# Patient Record
Sex: Male | Born: 1950 | Race: White | Hispanic: No | Marital: Married | Smoking: Former smoker
Health system: Southern US, Community
[De-identification: ages and names within clinical notes are randomized; demographics above are authoritative.]

## PROBLEM LIST (undated history)

## (undated) DIAGNOSIS — M1990 Unspecified osteoarthritis, unspecified site: Secondary | ICD-10-CM

## (undated) DIAGNOSIS — J189 Pneumonia, unspecified organism: Secondary | ICD-10-CM

## (undated) DIAGNOSIS — E7800 Pure hypercholesterolemia, unspecified: Secondary | ICD-10-CM

## (undated) DIAGNOSIS — I10 Essential (primary) hypertension: Secondary | ICD-10-CM

## (undated) HISTORY — PX: GANGLION CYST EXCISION: SHX1691

## (undated) HISTORY — PX: HERNIA REPAIR: SHX51

## (undated) HISTORY — PX: CYST REMOVAL NECK: SHX6281

---

## 2006-05-11 ENCOUNTER — Ambulatory Visit: Payer: Self-pay | Admitting: Nurse Practitioner

## 2011-02-12 ENCOUNTER — Ambulatory Visit: Payer: Self-pay | Admitting: Urology

## 2013-02-28 IMAGING — CT CT ABDOMEN AND PELVIS WITHOUT AND WITH CONTRAST
2 of 4 series · 14 of 32 positions shown, 19 images · non-contrast
Comparison: none

REASON FOR EXAM: Abd Pain
COMMENTS:

[Series 4: abd with 5.0 i40f 3 · axial · 0.97mm/px · z∈[-436,-36]mm · 8 of 104 slices shown, 13 images]
[im 12/104  soft-tissue]
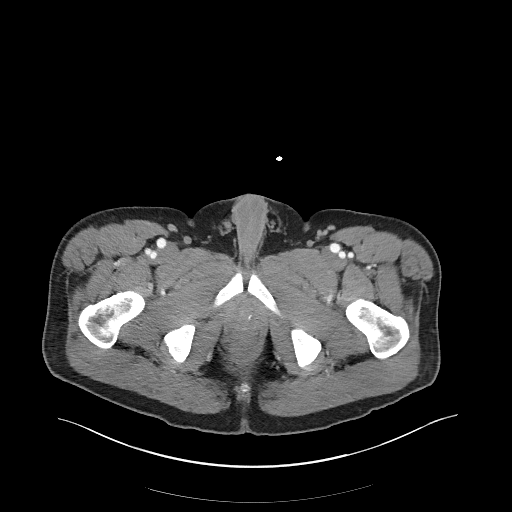
[im 12/104  bone]
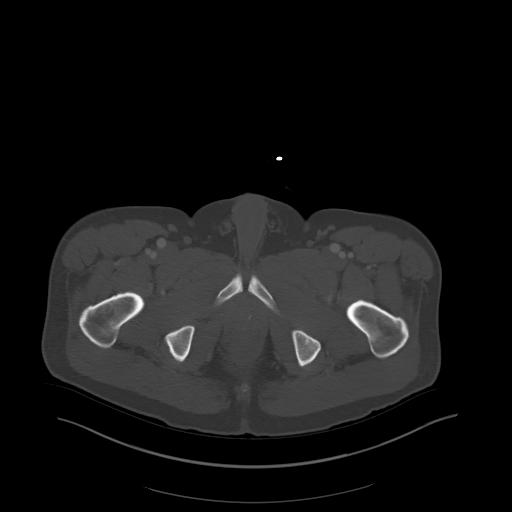
[im 23/104  soft-tissue]
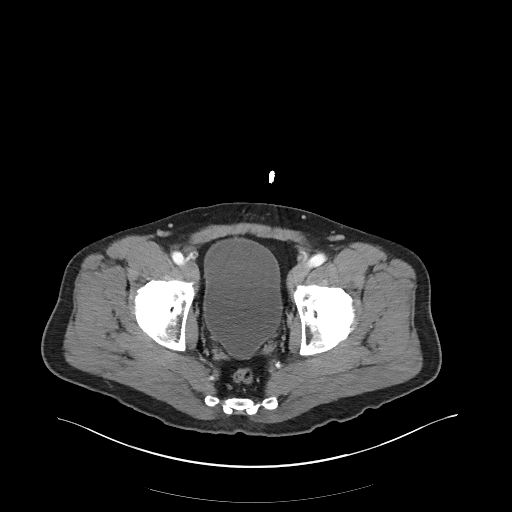
[im 35/104  soft-tissue]
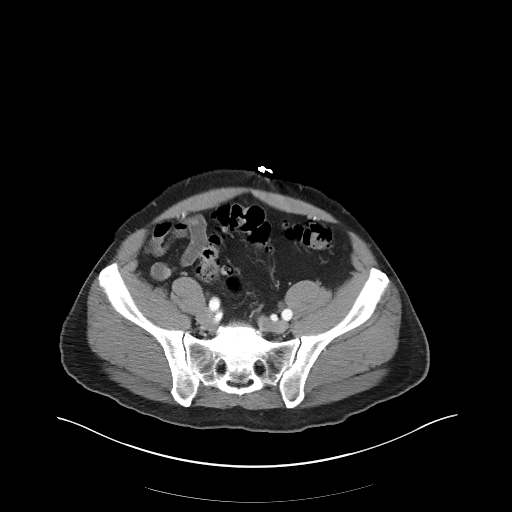
[im 46/104  soft-tissue]
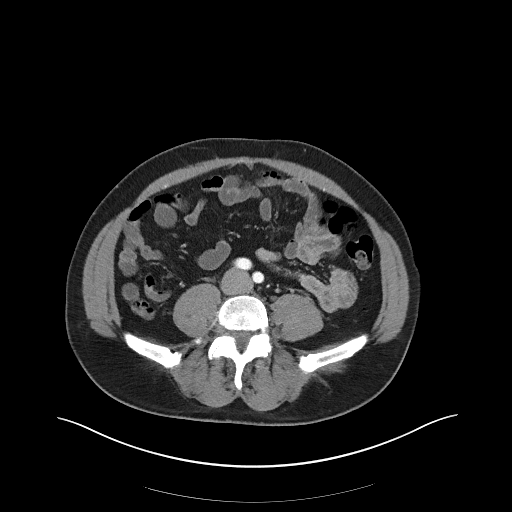
[im 58/104  soft-tissue]
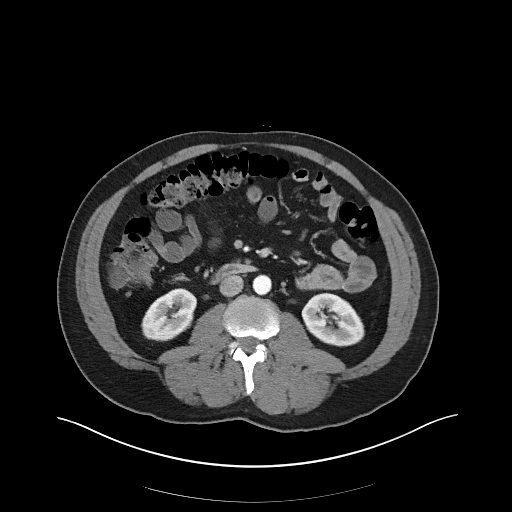
[im 58/104  lung]
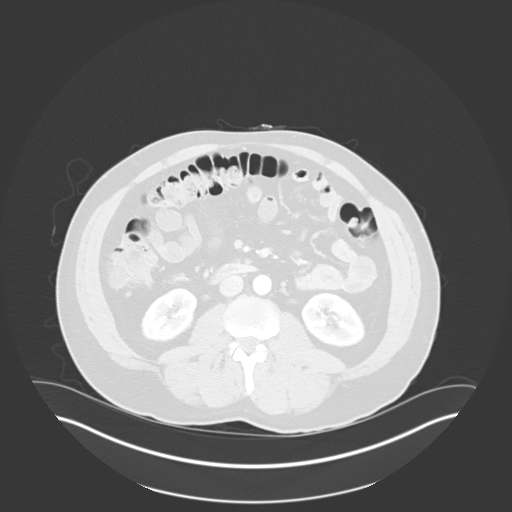
[im 69/104  soft-tissue]
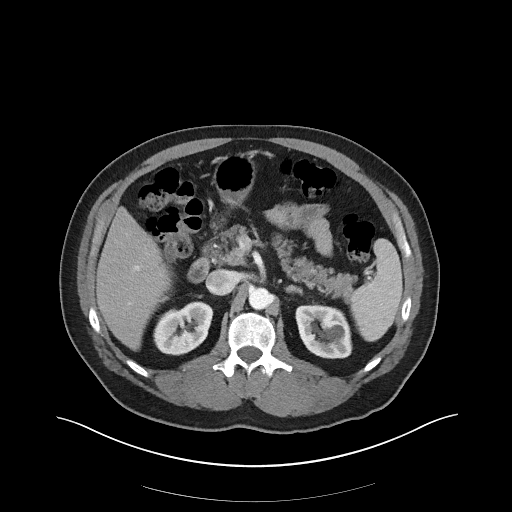
[im 69/104  lung]
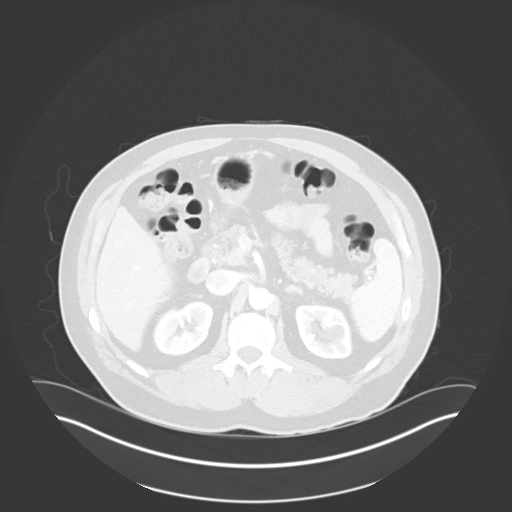
[im 81/104  soft-tissue]
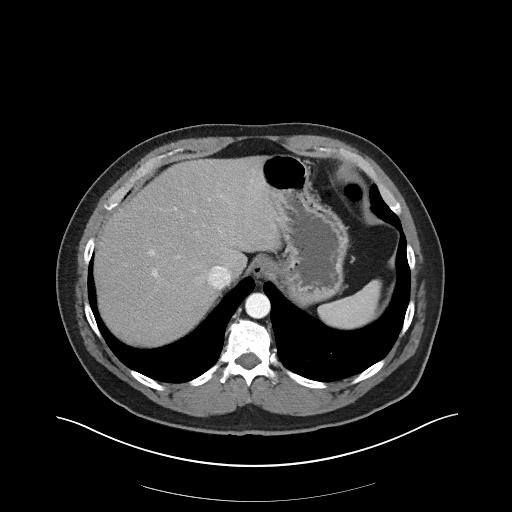
[im 81/104  lung]
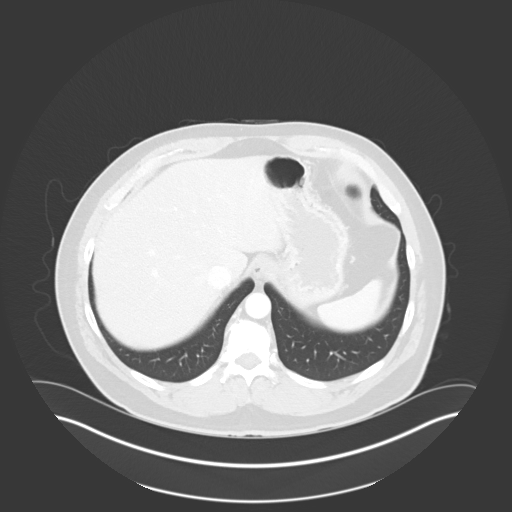
[im 92/104  soft-tissue]
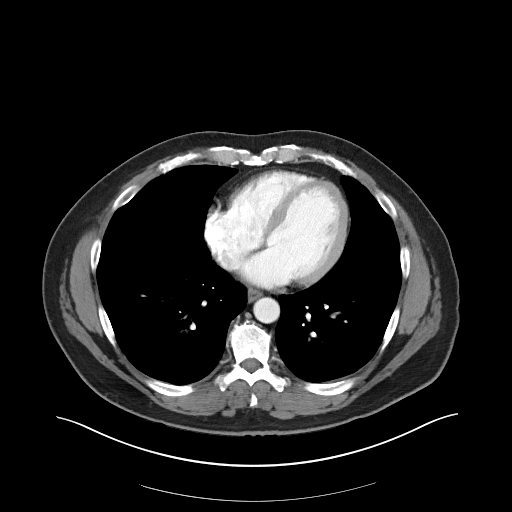
[im 92/104  lung]
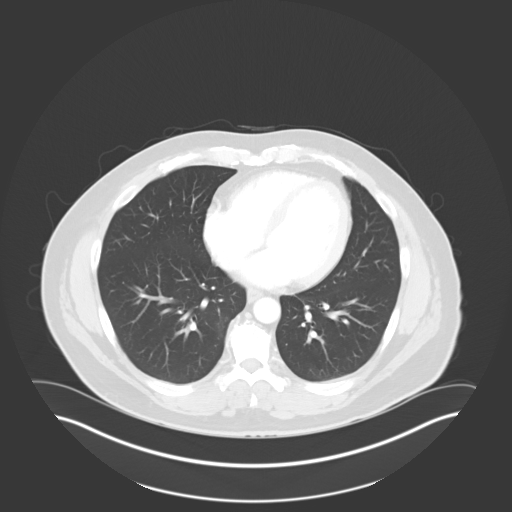

[Series 6: abd delay 5.0 i40f 3 · axial · delayed · 0.97mm/px · z∈[-436,-151]mm · 6 of 104 slices shown]
[im 12/104  soft-tissue]
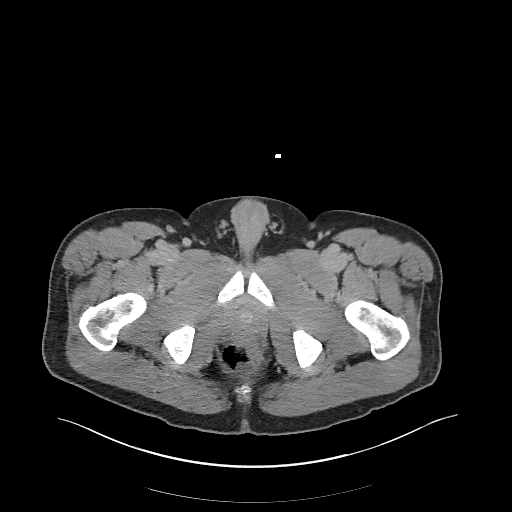
[im 23/104  soft-tissue]
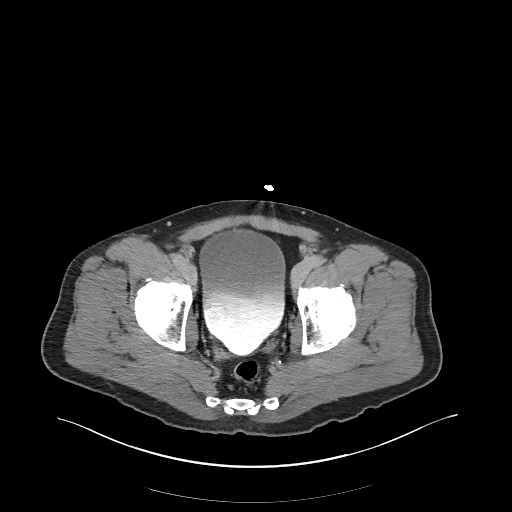
[im 35/104  soft-tissue]
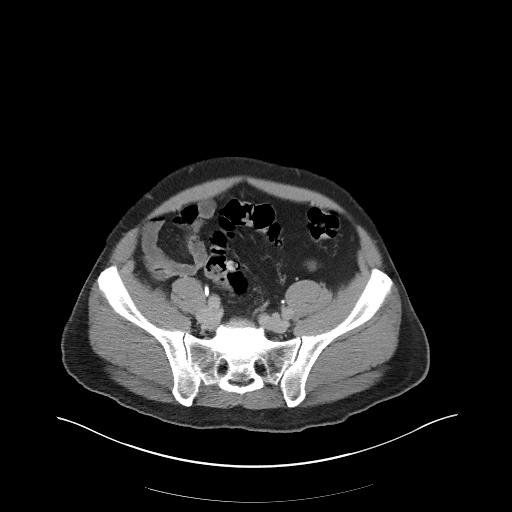
[im 46/104  soft-tissue]
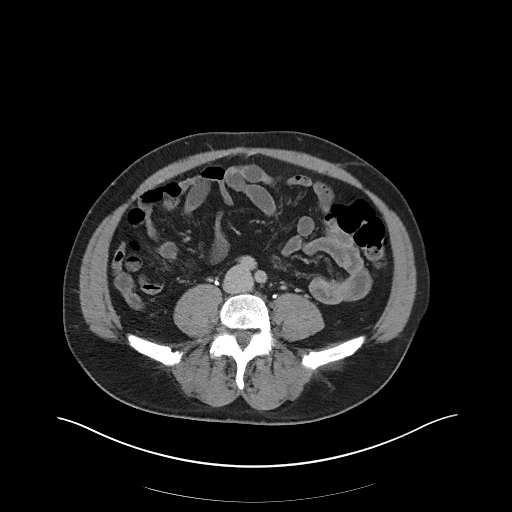
[im 58/104  soft-tissue]
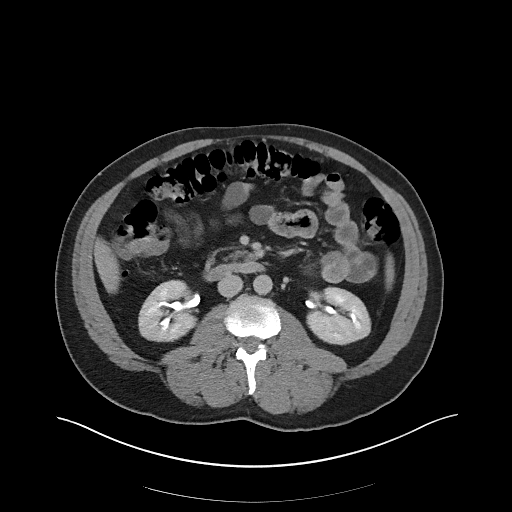
[im 69/104  soft-tissue]
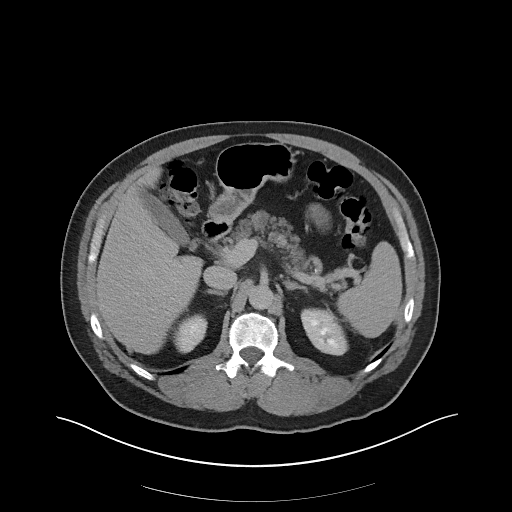

[14 of 32 positions shown; findings below may reference images not displayed]

PROCEDURE:     KCT - KCT ABDOMEN/PELVIS W/WO  - February 12, 2011  [DATE]

RESULT:     A triphasic CT scan was performed through the abdomen and pelvis
at 5 mm intervals and slice thicknesses. Review of multiplanar reconstructed
images was performed separately on the VIA monitor. The patient received 100
cc of Isovue 370 intravenously for the postcontrast study. The patient did
not receive oral contrast material.

The kidneys are normal in contour. There is a hypodensity in the upper pole
of the left kidney which measures approximately 1.8 by 1.3 cm. It exhibits
Hounsfield measurement of +1 precontrast, +6 postcontrast, and +14 on
delayed images. I see no lesions elsewhere in the left kidney. The right
kidney exhibits no abnormal findings. Delayed images reveal a normal
appearance of contrast within the renal collecting systems. The prostate
gland is enlarged and produces a moderate impression upon the urinary
bladder base.

The liver, gallbladder, spleen, stomach, pancreas, adrenal glands, and
periaortic and pericaval regions are normal in appearance. The unopacified
loops of small and large bowel exhibit no acute abnormality. The lumbar
vertebral bodies are preserved in height. The lung bases are clear.
IMPRESSION: 1. There is a hypodensity in the upper pole of the right kidney with
Hounsfield measurements most compatible with a cyst. I do not see evidence
of other parenchymal lesions of the kidneys.
2. There is no evidence of obstruction of either kidney or ureter. I see no
calcified stones.
3. There is enlargement of the prostate gland. The urinary bladder exhibits
no acute abnormality.
4. I see no acute abnormality elsewhere within the abdomen or pelvis.

## 2014-07-16 ENCOUNTER — Ambulatory Visit: Payer: Self-pay | Admitting: Nurse Practitioner

## 2022-05-14 ENCOUNTER — Other Ambulatory Visit (HOSPITAL_COMMUNITY): Payer: Self-pay | Admitting: Orthopedic Surgery

## 2022-05-14 DIAGNOSIS — G8918 Other acute postprocedural pain: Secondary | ICD-10-CM

## 2022-06-04 ENCOUNTER — Encounter (HOSPITAL_COMMUNITY)
Admission: RE | Admit: 2022-06-04 | Discharge: 2022-06-04 | Disposition: A | Discharge status: Home / Self Care | Payer: Medicare Other | Source: Ambulatory Visit | Attending: Orthopedic Surgery | Admitting: Orthopedic Surgery

## 2022-06-04 DIAGNOSIS — M25569 Pain in unspecified knee: Secondary | ICD-10-CM | POA: Diagnosis present

## 2022-06-04 DIAGNOSIS — G8918 Other acute postprocedural pain: Secondary | ICD-10-CM | POA: Insufficient documentation

## 2022-06-04 MED ORDER — TECHNETIUM TC 99M MEDRONATE IV KIT
20.0000 | PACK | Freq: Once | INTRAVENOUS | Status: AC | PRN
  Administered 2022-06-04: 20 via INTRAVENOUS

## 2022-06-20 ENCOUNTER — Ambulatory Visit: Payer: Self-pay | Admitting: Physician Assistant

## 2022-06-20 DIAGNOSIS — T84033S Mechanical loosening of internal left knee prosthetic joint, sequela: Secondary | ICD-10-CM

## 2022-06-20 NOTE — H&P (Signed)
TOTAL KNEE REVISION ADMISSION H&P  Patient is being admitted for left revision total knee arthroplasty.  Subjective:  Chief Complaint:left knee pain.  HPI: Cameron Holmes, 72 y.o. male, has a history of pain and functional disability in the left knee(s) due to failed previous arthroplasty and patient has failed non-surgical conservative treatments for greater than 12 weeks to include NSAID's and/or analgesics and activity modification. The indications for the revision of the total knee arthroplasty are loosening of one or more components. Onset of symptoms was gradual starting 2 years ago with gradually worsening course since that time.  Prior procedures on the left knee(s) include arthroplasty.  Patient currently rates pain in the left knee(s) at 7 out of 10 with activity. There is night pain, worsening of pain with activity and weight bearing, pain that interferes with activities of daily living, pain with passive range of motion, and joint swelling.  Patient has evidence of prosthetic loosening by imaging studies. This condition presents safety issues increasing the risk of falls.   There is no current active infection.   PMH: HTN, HLD  Meds: Lisinopril, simvastatin, aspirin, sildenafil  Not on File  Social History   Tobacco Use   Smoking status: Not on file   Smokeless tobacco: Not on file  Substance Use Topics   Alcohol use: Not on file    No family history on file.    Review of Systems  HENT:  Positive for hearing loss.   Musculoskeletal:  Positive for arthralgias and joint swelling.  Psychiatric/Behavioral:  The patient is nervous/anxious.   All other systems reviewed and are negative.    Objective:  Physical Exam Constitutional:      General: He is not in acute distress.    Appearance: Normal appearance.  HENT:     Head: Normocephalic and atraumatic.  Eyes:     Extraocular Movements: Extraocular movements intact.     Pupils: Pupils are equal, round, and reactive to  light.  Cardiovascular:     Rate and Rhythm: Normal rate and regular rhythm.     Pulses: Normal pulses.     Heart sounds: Normal heart sounds.  Pulmonary:     Effort: Pulmonary effort is normal. No respiratory distress.     Breath sounds: Normal breath sounds. No wheezing.  Abdominal:     General: Abdomen is flat. Bowel sounds are normal. There is no distension.     Palpations: Abdomen is soft.     Tenderness: There is no abdominal tenderness.  Musculoskeletal:     Cervical back: Normal range of motion and neck supple.     Left knee: Swelling and bony tenderness present. No effusion or erythema. Tenderness present.  Lymphadenopathy:     Cervical: No cervical adenopathy.  Skin:    General: Skin is warm and dry.     Findings: No erythema or rash.  Neurological:     General: No focal deficit present.     Mental Status: He is alert and oriented to person, place, and time.  Psychiatric:        Mood and Affect: Mood normal.        Behavior: Behavior normal.     Vital signs in last 24 hours:   Labs:    Imaging Review Plain radiographs demonstrate  s/p total knee for  degenerative joint disease of the left knee(s). The overall alignment is neutral.There is evidence of loosening of the femoral and tibial components. The bone quality appears to be good for age and  reported activity level.     Assessment/Plan:  End stage arthritis, left knee(s) with failed previous arthroplasty.   The patient history, physical examination, clinical judgment of the provider and imaging studies are consistent with end stage degenerative joint disease of the left knee(s), previous total knee arthroplasty. Revision total knee arthroplasty is deemed medically necessary. The treatment options including medical management, injection therapy, arthroscopy and revision arthroplasty were discussed at length. The risks and benefits of revision total knee arthroplasty were presented and reviewed. The risks due  to aseptic loosening, infection, stiffness, patella tracking problems, thromboembolic complications and other imponderables were discussed. The patient acknowledged the explanation, agreed to proceed with the plan and consent was signed. Patient is being admitted for inpatient treatment for surgery, pain control, PT, OT, prophylactic antibiotics, VTE prophylaxis, progressive ambulation and ADL's and discharge planning.The patient is planning to be discharged home with home health services

## 2022-06-20 NOTE — H&P (View-Only) (Signed)
TOTAL KNEE REVISION ADMISSION H&P  Patient is being admitted for left revision total knee arthroplasty.  Subjective:  Chief Complaint:left knee pain.  HPI: Cameron Holmes, 72 y.o. male, has a history of pain and functional disability in the left knee(s) due to failed previous arthroplasty and patient has failed non-surgical conservative treatments for greater than 12 weeks to include NSAID's and/or analgesics and activity modification. The indications for the revision of the total knee arthroplasty are loosening of one or more components. Onset of symptoms was gradual starting 2 years ago with gradually worsening course since that time.  Prior procedures on the left knee(s) include arthroplasty.  Patient currently rates pain in the left knee(s) at 7 out of 10 with activity. There is night pain, worsening of pain with activity and weight bearing, pain that interferes with activities of daily living, pain with passive range of motion, and joint swelling.  Patient has evidence of prosthetic loosening by imaging studies. This condition presents safety issues increasing the risk of falls.   There is no current active infection.   PMH: HTN, HLD  Meds: Lisinopril, simvastatin, aspirin, sildenafil  Not on File  Social History   Tobacco Use   Smoking status: Not on file   Smokeless tobacco: Not on file  Substance Use Topics   Alcohol use: Not on file    No family history on file.    Review of Systems  HENT:  Positive for hearing loss.   Musculoskeletal:  Positive for arthralgias and joint swelling.  Psychiatric/Behavioral:  The patient is nervous/anxious.   All other systems reviewed and are negative.    Objective:  Physical Exam Constitutional:      General: He is not in acute distress.    Appearance: Normal appearance.  HENT:     Head: Normocephalic and atraumatic.  Eyes:     Extraocular Movements: Extraocular movements intact.     Pupils: Pupils are equal, round, and reactive to  light.  Cardiovascular:     Rate and Rhythm: Normal rate and regular rhythm.     Pulses: Normal pulses.     Heart sounds: Normal heart sounds.  Pulmonary:     Effort: Pulmonary effort is normal. No respiratory distress.     Breath sounds: Normal breath sounds. No wheezing.  Abdominal:     General: Abdomen is flat. Bowel sounds are normal. There is no distension.     Palpations: Abdomen is soft.     Tenderness: There is no abdominal tenderness.  Musculoskeletal:     Cervical back: Normal range of motion and neck supple.     Left knee: Swelling and bony tenderness present. No effusion or erythema. Tenderness present.  Lymphadenopathy:     Cervical: No cervical adenopathy.  Skin:    General: Skin is warm and dry.     Findings: No erythema or rash.  Neurological:     General: No focal deficit present.     Mental Status: He is alert and oriented to person, place, and time.  Psychiatric:        Mood and Affect: Mood normal.        Behavior: Behavior normal.     Vital signs in last 24 hours:   Labs:    Imaging Review Plain radiographs demonstrate  s/p total knee for  degenerative joint disease of the left knee(s). The overall alignment is neutral.There is evidence of loosening of the femoral and tibial components. The bone quality appears to be good for age and  reported activity level.     Assessment/Plan:  End stage arthritis, left knee(s) with failed previous arthroplasty.   The patient history, physical examination, clinical judgment of the provider and imaging studies are consistent with end stage degenerative joint disease of the left knee(s), previous total knee arthroplasty. Revision total knee arthroplasty is deemed medically necessary. The treatment options including medical management, injection therapy, arthroscopy and revision arthroplasty were discussed at length. The risks and benefits of revision total knee arthroplasty were presented and reviewed. The risks due  to aseptic loosening, infection, stiffness, patella tracking problems, thromboembolic complications and other imponderables were discussed. The patient acknowledged the explanation, agreed to proceed with the plan and consent was signed. Patient is being admitted for inpatient treatment for surgery, pain control, PT, OT, prophylactic antibiotics, VTE prophylaxis, progressive ambulation and ADL's and discharge planning.The patient is planning to be discharged home with home health services

## 2022-07-02 NOTE — Patient Instructions (Signed)
SURGICAL WAITING ROOM VISITATION  Patients having surgery or a procedure may have no more than 2 support people in the waiting area - these visitors may rotate.    Children under the age of 46 must have an adult with them who is not the patient.  Due to an increase in RSV and influenza rates and associated hospitalizations, children ages 22 and under may not visit patients in [place].  If the patient needs to stay at the hospital during part of their recovery, the visitor guidelines for inpatient rooms apply. Pre-op nurse will coordinate an appropriate time for 1 support person to accompany patient in pre-op.  This support person may not rotate.    Please refer to the [facility] website for the visitor guidelines for Inpatients (after your surgery is over and you are in a regular room).    Your procedure is scheduled on: 07/14/22   Report to [facility] Main Entrance    Report to admitting at 11:30 AM   Call this number if you have problems the morning of surgery [phone]   Do not eat food :After Midnight.   After Midnight you may have the following liquids until 11:00 AM DAY OF SURGERY  Water Non-Citrus Juices (without pulp, NO RED-Apple, White grape, White cranberry) Black Coffee (NO MILK/CREAM OR CREAMERS, sugar ok)  Clear Tea (NO MILK/CREAM OR CREAMERS, sugar ok) regular and decaf                             Plain Jell-O (NO RED)                                           Fruit ices (not with fruit pulp, NO RED)                                     Popsicles (NO RED)                                                               Sports drinks like Gatorade (NO RED)     The day of surgery:  Drink ONE (1) Pre-Surgery Clear Ensure at 11:00 AM the morning of surgery. Drink in one sitting. Do not sip.  This drink was given to you during your hospital  pre-op appointment visit. Nothing else to drink after completing the  Pre-Surgery Clear Ensure.           If you have questions, please contact your surgeon's office.   FOLLOW BOWEL PREP AND ANY ADDITIONAL PRE OP INSTRUCTIONS YOU RECEIVED FROM YOUR SURGEON'S OFFICE!!!     Oral Hygiene is also important to reduce your risk of infection.                                    Remember - BRUSH YOUR TEETH THE MORNING OF SURGERY WITH YOUR REGULAR TOOTHPASTE  DENTURES WILL BE REMOVED PRIOR TO SURGERY PLEASE DO NOT APPLY "Poly grip" OR  ADHESIVES!!!   Take these medicines the morning of surgery with A SIP OF WATER: None                              You may not have any metal on your body including jewelry, and body piercing             Do not wear lotions, powders, cologne, or deodorant  Do not shave  48 hours prior to surgery.               Men may shave face and neck.   Do not bring valuables to the hospital. [place].   Contacts, glasses, dentures or bridgework may not be worn into surgery.   Bring small overnight bag day of surgery.   DO NOT [place]. PHARMACY WILL DISPENSE MEDICATIONS LISTED ON YOUR MEDICATION LIST TO YOU DURING YOUR ADMISSION [place]!    Special Instructions: Bring a copy of your healthcare power of attorney and living will documents the day of surgery if you haven't scanned them before.              Please read over the following fact sheets you were given: IF [place] [phone] Schneiders    If you received a COVID test during your pre-op visit  it is requested that you wear a mask when out in public, stay away from anyone that may not be feeling well and notify your surgeon if you develop symptoms. If you test positive for Covid or have been in contact with anyone that has tested positive in the last 10 days please notify you surgeon.  [facility] - Preparing for Surgery Before surgery, you can play an important role.   Because skin is not sterile, your skin needs to be as free of germs as possible.  You can reduce the number of germs on your skin by washing with CHG (chlorahexidine gluconate) soap before surgery.  CHG is an antiseptic cleaner which kills germs and bonds with the skin to continue killing germs even after washing. Please DO NOT use if you have an allergy to CHG or antibacterial soaps.  If your skin becomes reddened/irritated stop using the CHG and inform your nurse when you arrive at Short Stay. Do not shave (including legs and underarms) for at least 48 hours prior to the first CHG shower.  You may shave your face/neck.  Please follow these instructions carefully:  1.  Shower with CHG Soap the night before surgery and the  morning of surgery.  2.  If you choose to wash your hair, wash your hair first as usual with your normal  shampoo.  3.  After you shampoo, rinse your hair and body thoroughly to remove the shampoo.                             4.  Use CHG as you would any other liquid soap.  You can apply chg directly to the skin and wash.  Gently with a scrungie or clean washcloth.  5.  Apply the CHG Soap to your body ONLY FROM THE NECK DOWN.   Do   not use on face/ open  Wound or open sores. Avoid contact with eyes, ears mouth and   genitals (private parts).                       Wash face,  Genitals (private parts) with your normal soap.             6.  Wash thoroughly, paying special attention to the area where your    surgery  will be performed.  7.  Thoroughly rinse your body with warm water from the neck down.  8.  DO NOT shower/wash with your normal soap after using and rinsing off the CHG Soap.                9.  Pat yourself dry with a clean towel.            10.  Wear clean pajamas.            11.  Place clean sheets on your bed the night of your first shower and do not  sleep with pets. Day of Surgery : Do not apply any lotions/deodorants the morning of  surgery.  Please wear clean clothes to the hospital/surgery center.  FAILURE TO FOLLOW THESE INSTRUCTIONS MAY RESULT IN THE CANCELLATION OF YOUR SURGERY  PATIENT SIGNATURE_________________________________  NURSE SIGNATURE__________________________________  ________________________________________________________________________      Rogelia Mire  An incentive spirometer is a tool that can help keep your lungs clear and active. This tool measures how well you are filling your lungs with each breath. Taking long deep breaths may help reverse or decrease the chance of developing breathing (pulmonary) problems (especially infection) following: A long period of time when you are unable to move or be active. BEFORE THE PROCEDURE  If the spirometer includes an indicator to show your best effort, your nurse or respiratory therapist will set it to a desired goal. If possible, sit up straight or lean slightly forward. Try not to slouch. Hold the incentive spirometer in an upright position. INSTRUCTIONS FOR USE  Sit on the edge of your bed if possible, or sit up as far as you can in bed or on a chair. Hold the incentive spirometer in an upright position. Breathe out normally. Place the mouthpiece in your mouth and seal your lips tightly around it. Breathe in slowly and as deeply as possible, raising the piston or the ball toward the top of the column. Hold your breath for 3-5 seconds or for as long as possible. Allow the piston or ball to fall to the bottom of the column. Remove the mouthpiece from your mouth and breathe out normally. Rest for a few seconds and repeat Steps 1 through 7 at least 10 times every 1-2 hours when you are awake. Take your time and take a few normal breaths between deep breaths. The spirometer may include an indicator to show your best effort. Use the indicator as a goal to work toward during each repetition. After each set of 10 deep breaths, practice coughing to  be sure your lungs are clear. If you have an incision (the cut made at the time of surgery), support your incision when coughing by placing a pillow or rolled up towels firmly against it. Once you are able to get out of bed, walk around indoors and cough well. You may stop using the incentive spirometer when instructed by your caregiver.  RISKS AND COMPLICATIONS Take your time so you do not get dizzy or light-headed. If you are  in pain, you may need to take or ask for pain medication before doing incentive spirometry. It is harder to take a deep breath if you are having pain. AFTER USE Rest and breathe slowly and easily. It can be helpful to keep track of a log of your progress. Your caregiver can provide you with a simple table to help with this. If you are using the spirometer at home, follow these instructions: [place] IF:  You are having difficultly using the spirometer. You have trouble using the spirometer as often as instructed. Your pain medication is not giving enough relief while using the spirometer. You develop fever of 100.5 F (38.1 C) or higher. SEEK IMMEDIATE MEDICAL CARE IF:  You cough up bloody sputum that had not been present before. You develop fever of 102 F (38.9 C) or greater. You develop worsening pain at or near the incision site. MAKE SURE YOU:  Understand these instructions. Will watch your condition. Will get help right away if you are not doing well or get worse. Document Released: 09/27/2006 Document Revised: 08/09/2011 Document Reviewed: 11/28/2006 Exit[employer] Patient Information 2014 [employer], [place].   ________________________________________________________________________ WHAT IS A BLOOD TRANSFUSION? Blood Transfusion Information  A transfusion is the replacement of blood or some of its parts. Blood is made up of multiple cells which provide different functions. Red blood cells carry oxygen and are used for blood loss replacement. White blood  cells fight against infection. Platelets control bleeding. Plasma helps clot blood. Other blood products are available for specialized needs, such as hemophilia or other clotting disorders. BEFORE THE TRANSFUSION  Who gives blood for transfusions?  Healthy volunteers who are fully evaluated to make sure their blood is safe. This is blood bank blood. Transfusion therapy is the safest it has ever been in the practice of medicine. Before blood is taken from a donor, a complete history is taken to make sure that person has no history of diseases nor engages in risky social behavior (examples are intravenous drug use or sexual activity with multiple partners). The donor's travel history is screened to minimize risk of transmitting infections, such as malaria. The donated blood is tested for signs of infectious diseases, such as HIV and hepatitis. The blood is then tested to be sure it is compatible with you in order to minimize the chance of a transfusion reaction. If you or a relative donates blood, this is often done in anticipation of surgery and is not appropriate for emergency situations. It takes many days to process the donated blood. RISKS AND COMPLICATIONS Although transfusion therapy is very safe and saves many lives, the main dangers of transfusion include:  Getting an infectious disease. Developing a transfusion reaction. This is an allergic reaction to something in the blood you were given. Every precaution is taken to prevent this. The decision to have a blood transfusion has been considered carefully by your caregiver before blood is given. Blood is not given unless the benefits outweigh the risks. AFTER THE TRANSFUSION Right after receiving a blood transfusion, you will usually feel much better and more energetic. This is especially true if your red blood cells have gotten low (anemic). The transfusion raises the level of the red blood cells which carry oxygen, and this usually causes an  energy increase. The nurse administering the transfusion will monitor you carefully for complications. HOME CARE INSTRUCTIONS  No special instructions are needed after a transfusion. You may find your energy is better. Speak with your caregiver about any  limitations on activity for underlying diseases you may have. SEEK MEDICAL CARE IF:  Your condition is not improving after your transfusion. You develop redness or irritation at the intravenous (IV) site. SEEK IMMEDIATE MEDICAL CARE IF:  Any of the following symptoms occur over the next 12 hours: Shaking chills. You have a temperature by mouth above 102 F (38.9 C), not controlled by medicine. Chest, back, or muscle pain. People around you feel you are not acting correctly or are confused. Shortness of breath or difficulty breathing. Dizziness and fainting. You get a rash or develop hives. You have a decrease in urine output. Your urine turns a dark color or changes to pink, red, or brown. Any of the following symptoms occur over the next 10 days: You have a temperature by mouth above 102 F (38.9 C), not controlled by medicine. Shortness of breath. Weakness after normal activity. The white part of the eye turns yellow (jaundice). You have a decrease in the amount of urine or are urinating less often. Your urine turns a dark color or changes to pink, red, or brown. Document Released: 05/14/2000 Document Revised: 08/09/2011 Document Reviewed: 01/01/2008 [facility] Patient Information 2014 [place], [place].  _______________________________________________________________________

## 2022-07-02 NOTE — Progress Notes (Signed)
COVID Vaccine Completed:  Date of COVID positive in last 90 days:  PCP - August Olinger, [place] Cardiologist -   Chest x-ray -  EKG -  Stress Test -  ECHO -  Cardiac Cath -  Pacemaker/ICD device last checked: Spinal Cord Stimulator:  Bowel Prep -   Sleep Study -  CPAP -   Fasting Blood Sugar -  Checks Blood Sugar _____ times a day  Last dose of GLP1 agonist-  N/A GLP1 instructions:  N/A   Last dose of SGLT-2 inhibitors-  N/A SGLT-2 instructions: N/A   Blood Thinner Instructions: Aspirin Instructions: ASA 81 Last Dose:  Activity level:  Can go up a flight of stairs and perform activities of daily living without stopping and without symptoms of chest pain or shortness of breath.  Able to exercise without symptoms  Unable to go up a flight of stairs without symptoms of     Anesthesia review:   Patient denies shortness of breath, fever, cough and chest pain at PAT appointment  Patient verbalized understanding of instructions that were given to them at the PAT appointment. Patient was also instructed that they will need to review over the PAT instructions again at home before surgery.

## 2022-07-05 ENCOUNTER — Encounter (HOSPITAL_COMMUNITY): Payer: Self-pay

## 2022-07-05 ENCOUNTER — Encounter (HOSPITAL_COMMUNITY)
Admission: RE | Admit: 2022-07-05 | Discharge: 2022-07-05 | Disposition: A | Discharge status: Home / Self Care | Payer: Medicare Other | Source: Ambulatory Visit | Attending: Orthopedic Surgery | Admitting: Orthopedic Surgery

## 2022-07-05 VITALS — BP 173/93 | HR 58 | Temp 97.5°F | Resp 16 | Ht 67.0 in | Wt 205.0 lb

## 2022-07-05 DIAGNOSIS — Z01818 Encounter for other preprocedural examination: Secondary | ICD-10-CM | POA: Diagnosis present

## 2022-07-05 DIAGNOSIS — T84033S Mechanical loosening of internal left knee prosthetic joint, sequela: Secondary | ICD-10-CM | POA: Insufficient documentation

## 2022-07-05 HISTORY — DX: Essential (primary) hypertension: I10

## 2022-07-05 HISTORY — DX: Unspecified osteoarthritis, unspecified site: M19.90

## 2022-07-05 HISTORY — DX: Pneumonia, unspecified organism: J18.9

## 2022-07-05 HISTORY — DX: Pure hypercholesterolemia, unspecified: E78.00

## 2022-07-05 LAB — CBC WITH DIFFERENTIAL/PLATELET
Abs Immature Granulocytes: 0.02 10*3/uL (ref 0.00–0.07)
Basophils Absolute: 0 10*3/uL (ref 0.0–0.1)
Basophils Relative: 1 %
Eosinophils Absolute: 0.1 10*3/uL (ref 0.0–0.5)
Eosinophils Relative: 4 %
HCT: 43.7 % (ref 39.0–52.0)
Hemoglobin: 14.3 g/dL (ref 13.0–17.0)
Immature Granulocytes: 1 %
Lymphocytes Relative: 33 %
Lymphs Abs: 1.2 10*3/uL (ref 0.7–4.0)
MCH: 29.5 pg (ref 26.0–34.0)
MCHC: 32.7 g/dL (ref 30.0–36.0)
MCV: 90.1 fL (ref 80.0–100.0)
Monocytes Absolute: 0.3 10*3/uL (ref 0.1–1.0)
Monocytes Relative: 8 %
Neutro Abs: 2 10*3/uL (ref 1.7–7.7)
Neutrophils Relative %: 53 %
Platelets: 205 10*3/uL (ref 150–400)
RBC: 4.85 MIL/uL (ref 4.22–5.81)
RDW: 12.3 % (ref 11.5–15.5)
WBC: 3.6 10*3/uL — ABNORMAL LOW (ref 4.0–10.5)
nRBC: 0 % (ref 0.0–0.2)

## 2022-07-05 LAB — COMPREHENSIVE METABOLIC PANEL
ALT: 18 U/L (ref 0–44)
AST: 17 U/L (ref 15–41)
Albumin: 4.6 g/dL (ref 3.5–5.0)
Alkaline Phosphatase: 44 U/L (ref 38–126)
Anion gap: 7 (ref 5–15)
BUN: 19 mg/dL (ref 8–23)
CO2: 25 mmol/L (ref 22–32)
Calcium: 9 mg/dL (ref 8.9–10.3)
Chloride: 107 mmol/L (ref 98–111)
Creatinine, Ser: 0.95 mg/dL (ref 0.61–1.24)
GFR, Estimated: >60 mL/min (ref >60)
Glucose, Bld: 114 mg/dL — ABNORMAL HIGH (ref 70–99)
Potassium: 4.6 mmol/L (ref 3.5–5.1)
Sodium: 139 mmol/L (ref 135–145)
Total Bilirubin: 0.8 mg/dL (ref 0.3–1.2)
Total Protein: 7.4 g/dL (ref 6.5–8.1)

## 2022-07-05 LAB — SURGICAL PCR SCREEN
MRSA, PCR: NEGATIVE
Staphylococcus aureus: NEGATIVE

## 2022-07-14 ENCOUNTER — Other Ambulatory Visit: Payer: Self-pay

## 2022-07-14 ENCOUNTER — Encounter (HOSPITAL_COMMUNITY): Payer: Self-pay | Admitting: Orthopedic Surgery

## 2022-07-14 ENCOUNTER — Encounter (HOSPITAL_COMMUNITY)
Admission: RE | Disposition: A | Discharge status: Home Health | Payer: Self-pay | Source: Ambulatory Visit | Attending: Orthopedic Surgery

## 2022-07-14 ENCOUNTER — Inpatient Hospital Stay (HOSPITAL_COMMUNITY): Payer: Medicare Other | Admitting: Certified Registered Nurse Anesthetist

## 2022-07-14 ENCOUNTER — Inpatient Hospital Stay (HOSPITAL_COMMUNITY): Payer: Medicare Other | Admitting: Physician Assistant

## 2022-07-14 ENCOUNTER — Inpatient Hospital Stay (HOSPITAL_COMMUNITY): Payer: Medicare Other

## 2022-07-14 ENCOUNTER — Inpatient Hospital Stay (HOSPITAL_COMMUNITY)
Admission: RE | Admit: 2022-07-14 | Discharge: 2022-07-15 | DRG: 468 | Disposition: A | Discharge status: Home Health | Payer: Medicare Other | Source: Ambulatory Visit | Attending: Orthopedic Surgery | Admitting: Orthopedic Surgery

## 2022-07-14 DIAGNOSIS — T84019A Broken internal joint prosthesis, unspecified site, initial encounter: Principal | ICD-10-CM

## 2022-07-14 DIAGNOSIS — T84033A Mechanical loosening of internal left knee prosthetic joint, initial encounter: Secondary | ICD-10-CM | POA: Diagnosis present

## 2022-07-14 DIAGNOSIS — M170 Bilateral primary osteoarthritis of knee: Secondary | ICD-10-CM | POA: Diagnosis present

## 2022-07-14 DIAGNOSIS — M1711 Unilateral primary osteoarthritis, right knee: Secondary | ICD-10-CM | POA: Diagnosis not present

## 2022-07-14 DIAGNOSIS — E7800 Pure hypercholesterolemia, unspecified: Secondary | ICD-10-CM | POA: Diagnosis present

## 2022-07-14 DIAGNOSIS — I10 Essential (primary) hypertension: Secondary | ICD-10-CM | POA: Diagnosis present

## 2022-07-14 DIAGNOSIS — M25562 Pain in left knee: Secondary | ICD-10-CM | POA: Diagnosis present

## 2022-07-14 DIAGNOSIS — Z419 Encounter for procedure for purposes other than remedying health state, unspecified: Secondary | ICD-10-CM

## 2022-07-14 DIAGNOSIS — Y792 Prosthetic and other implants, materials and accessory orthopedic devices associated with adverse incidents: Secondary | ICD-10-CM | POA: Diagnosis present

## 2022-07-14 HISTORY — PX: INJECTION KNEE: SHX2446

## 2022-07-14 HISTORY — PX: TOTAL KNEE REVISION: SHX996

## 2022-07-14 LAB — ABO/RH: ABO/RH(D): A POS

## 2022-07-14 LAB — TYPE AND SCREEN
ABO/RH(D): A POS
Antibody Screen: NEGATIVE

## 2022-07-14 SURGERY — TOTAL KNEE REVISION
Anesthesia: Spinal | Site: Knee | Laterality: Right

## 2022-07-14 MED ORDER — BUPIVACAINE HCL (PF) 0.5 % IJ SOLN
INTRAMUSCULAR | Status: AC
  Filled 2022-07-14: qty 30

## 2022-07-14 MED ORDER — SODIUM CHLORIDE 0.9 % IR SOLN
Status: DC | PRN
  Administered 2022-07-14: 3000 mL

## 2022-07-14 MED ORDER — ACETAMINOPHEN 325 MG PO TABS: 325.0000 mg | ORAL_TABLET | Freq: Four times a day (QID) | ORAL | Status: DC | PRN

## 2022-07-14 MED ORDER — DEXAMETHASONE SODIUM PHOSPHATE 10 MG/ML IJ SOLN
INTRAMUSCULAR | Status: AC
  Filled 2022-07-14: qty 1

## 2022-07-14 MED ORDER — ACETAMINOPHEN 500 MG PO TABS
1000.0000 mg | ORAL_TABLET | Freq: Four times a day (QID) | ORAL | Status: AC
  Administered 2022-07-14 – 2022-07-15 (×4): 1000 mg via ORAL
  Filled 2022-07-14 (×4): qty 2

## 2022-07-14 MED ORDER — ORAL CARE MOUTH RINSE: 15.0000 mL | Freq: Once | OROMUCOSAL | Status: AC

## 2022-07-14 MED ORDER — BUPIVACAINE HCL 0.25 % IJ SOLN
INTRAMUSCULAR | Status: DC | PRN
  Administered 2022-07-14: 4 mL

## 2022-07-14 MED ORDER — EPHEDRINE SULFATE-NACL 50-0.9 MG/10ML-% IV SOSY
PREFILLED_SYRINGE | INTRAVENOUS | Status: DC | PRN
  Administered 2022-07-14: 5 mg via INTRAVENOUS

## 2022-07-14 MED ORDER — METHOCARBAMOL 1000 MG/10ML IJ SOLN: 500.0000 mg | Freq: Four times a day (QID) | INTRAVENOUS | Status: DC | PRN

## 2022-07-14 MED ORDER — SODIUM CHLORIDE (PF) 0.9 % IJ SOLN
INTRAMUSCULAR | Status: AC
  Filled 2022-07-14: qty 50

## 2022-07-14 MED ORDER — EPHEDRINE 5 MG/ML INJ
INTRAVENOUS | Status: AC
  Filled 2022-07-14: qty 5

## 2022-07-14 MED ORDER — LACTATED RINGERS IV SOLN: INTRAVENOUS | Status: DC

## 2022-07-14 MED ORDER — ROPIVACAINE HCL 7.5 MG/ML IJ SOLN
INTRAMUSCULAR | Status: DC | PRN
  Administered 2022-07-14: 20 mL via PERINEURAL

## 2022-07-14 MED ORDER — ONDANSETRON HCL 4 MG/2ML IJ SOLN
INTRAMUSCULAR | Status: AC
  Filled 2022-07-14: qty 2

## 2022-07-14 MED ORDER — WATER FOR IRRIGATION, STERILE IR SOLN
Status: DC | PRN
  Administered 2022-07-14: 2000 mL

## 2022-07-14 MED ORDER — HYDROMORPHONE HCL 1 MG/ML IJ SOLN: 0.5000 mg | INTRAMUSCULAR | Status: DC | PRN

## 2022-07-14 MED ORDER — TRIAMCINOLONE ACETONIDE 40 MG/ML IJ SUSP
INTRAMUSCULAR | Status: AC
  Filled 2022-07-14: qty 1

## 2022-07-14 MED ORDER — ZOLPIDEM TARTRATE 5 MG PO TABS: 5.0000 mg | ORAL_TABLET | Freq: Every evening | ORAL | Status: DC | PRN

## 2022-07-14 MED ORDER — POLYETHYLENE GLYCOL 3350 17 G PO PACK: 17.0000 g | PACK | Freq: Every day | ORAL | Status: DC | PRN

## 2022-07-14 MED ORDER — BUPIVACAINE LIPOSOME 1.3 % IJ SUSP
INTRAMUSCULAR | Status: AC
  Filled 2022-07-14: qty 20

## 2022-07-14 MED ORDER — DEXMEDETOMIDINE HCL IN NACL 80 MCG/20ML IV SOLN
INTRAVENOUS | Status: DC | PRN
  Administered 2022-07-14 (×2): 4 ug via BUCCAL

## 2022-07-14 MED ORDER — SIMVASTATIN 20 MG PO TABS: 20.0000 mg | ORAL_TABLET | Freq: Every day | ORAL | Status: DC

## 2022-07-14 MED ORDER — ACETAMINOPHEN 500 MG PO TABS
1000.0000 mg | ORAL_TABLET | Freq: Once | ORAL | Status: AC
  Administered 2022-07-14: 1000 mg via ORAL
  Filled 2022-07-14: qty 2

## 2022-07-14 MED ORDER — TRIAMCINOLONE ACETONIDE 40 MG/ML IJ SUSP: INTRAMUSCULAR | Status: DC | PRN

## 2022-07-14 MED ORDER — CHLORHEXIDINE GLUCONATE 0.12 % MT SOLN
15.0000 mL | Freq: Once | OROMUCOSAL | Status: AC
  Administered 2022-07-14: 15 mL via OROMUCOSAL

## 2022-07-14 MED ORDER — PANTOPRAZOLE SODIUM 40 MG PO TBEC
40.0000 mg | DELAYED_RELEASE_TABLET | Freq: Every day | ORAL | Status: DC
  Administered 2022-07-14 – 2022-07-15 (×2): 40 mg via ORAL
  Filled 2022-07-14 (×2): qty 1

## 2022-07-14 MED ORDER — TRANEXAMIC ACID-NACL 1000-0.7 MG/100ML-% IV SOLN
1000.0000 mg | INTRAVENOUS | Status: AC
  Administered 2022-07-14: 1000 mg via INTRAVENOUS
  Filled 2022-07-14: qty 100

## 2022-07-14 MED ORDER — PROPOFOL 10 MG/ML IV BOLUS
INTRAVENOUS | Status: AC
  Filled 2022-07-14: qty 20

## 2022-07-14 MED ORDER — FENTANYL CITRATE PF 50 MCG/ML IJ SOSY
50.0000 ug | PREFILLED_SYRINGE | Freq: Once | INTRAMUSCULAR | Status: AC
  Administered 2022-07-14: 50 ug via INTRAVENOUS
  Filled 2022-07-14: qty 2

## 2022-07-14 MED ORDER — OXYCODONE HCL 5 MG/5ML PO SOLN: 5.0000 mg | Freq: Once | ORAL | Status: DC | PRN

## 2022-07-14 MED ORDER — HYDROMORPHONE HCL 1 MG/ML IJ SOLN: 0.2500 mg | INTRAMUSCULAR | Status: DC | PRN

## 2022-07-14 MED ORDER — ACETAMINOPHEN 10 MG/ML IV SOLN: 1000.0000 mg | Freq: Once | INTRAVENOUS | Status: DC | PRN

## 2022-07-14 MED ORDER — LISINOPRIL 10 MG PO TABS
10.0000 mg | ORAL_TABLET | Freq: Every day | ORAL | Status: DC
  Administered 2022-07-15: 10 mg via ORAL
  Filled 2022-07-14: qty 1

## 2022-07-14 MED ORDER — GLYCOPYRROLATE 0.2 MG/ML IJ SOLN
INTRAMUSCULAR | Status: DC | PRN
  Administered 2022-07-14 (×2): .1 mg via INTRAVENOUS

## 2022-07-14 MED ORDER — DEXAMETHASONE SODIUM PHOSPHATE 10 MG/ML IJ SOLN
INTRAMUSCULAR | Status: DC | PRN
  Administered 2022-07-14: 10 mg via INTRAVENOUS

## 2022-07-14 MED ORDER — BUPIVACAINE IN DEXTROSE 0.75-8.25 % IT SOLN
INTRATHECAL | Status: DC | PRN
  Administered 2022-07-14: 2 mL via INTRATHECAL

## 2022-07-14 MED ORDER — ISOPROPYL ALCOHOL 70 % SOLN
Status: DC | PRN
  Administered 2022-07-14: 1 via TOPICAL

## 2022-07-14 MED ORDER — POVIDONE-IODINE 10 % EX SWAB
2.0000 | Freq: Once | CUTANEOUS | Status: AC
  Administered 2022-07-14: 2 via TOPICAL

## 2022-07-14 MED ORDER — OXYCODONE HCL 5 MG PO TABS: 10.0000 mg | ORAL_TABLET | ORAL | Status: DC | PRN

## 2022-07-14 MED ORDER — BUPIVACAINE LIPOSOME 1.3 % IJ SUSP: 20.0000 mL | Freq: Once | INTRAMUSCULAR | Status: DC

## 2022-07-14 MED ORDER — METHOCARBAMOL 500 MG PO TABS: 500.0000 mg | ORAL_TABLET | Freq: Four times a day (QID) | ORAL | Status: DC | PRN

## 2022-07-14 MED ORDER — ONDANSETRON HCL 4 MG/2ML IJ SOLN
INTRAMUSCULAR | Status: DC | PRN
  Administered 2022-07-14: 4 mg via INTRAVENOUS

## 2022-07-14 MED ORDER — 0.9 % SODIUM CHLORIDE (POUR BTL) OPTIME
TOPICAL | Status: DC | PRN
  Administered 2022-07-14: 1000 mL

## 2022-07-14 MED ORDER — PROPOFOL 1000 MG/100ML IV EMUL
INTRAVENOUS | Status: AC
  Filled 2022-07-14: qty 100

## 2022-07-14 MED ORDER — DEXMEDETOMIDINE HCL IN NACL 80 MCG/20ML IV SOLN
INTRAVENOUS | Status: AC
  Filled 2022-07-14: qty 20

## 2022-07-14 MED ORDER — ASPIRIN 81 MG PO CHEW
81.0000 mg | CHEWABLE_TABLET | Freq: Two times a day (BID) | ORAL | Status: DC
  Administered 2022-07-15: 81 mg via ORAL
  Filled 2022-07-14: qty 1

## 2022-07-14 MED ORDER — BUPIVACAINE HCL 0.25 % IJ SOLN
INTRAMUSCULAR | Status: AC
  Filled 2022-07-14: qty 1

## 2022-07-14 MED ORDER — ONDANSETRON HCL 4 MG/2ML IJ SOLN: 4.0000 mg | Freq: Once | INTRAMUSCULAR | Status: DC | PRN

## 2022-07-14 MED ORDER — CEFAZOLIN SODIUM-DEXTROSE 2-4 GM/100ML-% IV SOLN
2.0000 g | Freq: Three times a day (TID) | INTRAVENOUS | Status: DC
  Administered 2022-07-14 – 2022-07-15 (×3): 2 g via INTRAVENOUS
  Filled 2022-07-14 (×3): qty 100

## 2022-07-14 MED ORDER — CEFAZOLIN SODIUM-DEXTROSE 2-4 GM/100ML-% IV SOLN
2.0000 g | INTRAVENOUS | Status: AC
  Administered 2022-07-14: 2 g via INTRAVENOUS
  Filled 2022-07-14: qty 100

## 2022-07-14 MED ORDER — SODIUM CHLORIDE 0.9 % IV SOLN: INTRAVENOUS | Status: DC

## 2022-07-14 MED ORDER — OXYCODONE HCL 5 MG PO TABS: 5.0000 mg | ORAL_TABLET | Freq: Once | ORAL | Status: DC | PRN

## 2022-07-14 MED ORDER — PHENOL 1.4 % MT LIQD: 1.0000 | OROMUCOSAL | Status: DC | PRN

## 2022-07-14 MED ORDER — DIPHENHYDRAMINE HCL 12.5 MG/5ML PO ELIX: 12.5000 mg | ORAL_SOLUTION | ORAL | Status: DC | PRN

## 2022-07-14 MED ORDER — ONDANSETRON HCL 4 MG PO TABS: 4.0000 mg | ORAL_TABLET | Freq: Four times a day (QID) | ORAL | Status: DC | PRN

## 2022-07-14 MED ORDER — ONDANSETRON HCL 4 MG/2ML IJ SOLN: 4.0000 mg | Freq: Four times a day (QID) | INTRAMUSCULAR | Status: DC | PRN

## 2022-07-14 MED ORDER — PROPOFOL 500 MG/50ML IV EMUL
INTRAVENOUS | Status: DC | PRN
  Administered 2022-07-14: 50 ug/kg/min via INTRAVENOUS

## 2022-07-14 MED ORDER — TRANEXAMIC ACID 1000 MG/10ML IV SOLN
2000.0000 mg | INTRAVENOUS | Status: DC
  Filled 2022-07-14: qty 20

## 2022-07-14 MED ORDER — KETOROLAC TROMETHAMINE 15 MG/ML IJ SOLN
7.5000 mg | Freq: Four times a day (QID) | INTRAMUSCULAR | Status: AC
  Administered 2022-07-14 – 2022-07-15 (×4): 7.5 mg via INTRAVENOUS
  Filled 2022-07-14 (×4): qty 1

## 2022-07-14 MED ORDER — MIDAZOLAM HCL 2 MG/2ML IJ SOLN
1.0000 mg | Freq: Once | INTRAMUSCULAR | Status: AC
  Administered 2022-07-14: 1 mg via INTRAVENOUS
  Filled 2022-07-14: qty 2

## 2022-07-14 MED ORDER — SODIUM CHLORIDE (PF) 0.9 % IJ SOLN
INTRAMUSCULAR | Status: AC
  Filled 2022-07-14: qty 10

## 2022-07-14 MED ORDER — BUPIVACAINE LIPOSOME 1.3 % IJ SUSP
INTRAMUSCULAR | Status: DC | PRN
  Administered 2022-07-14: 20 mL

## 2022-07-14 MED ORDER — OXYCODONE HCL 5 MG PO TABS
5.0000 mg | ORAL_TABLET | ORAL | Status: DC | PRN
  Administered 2022-07-14 – 2022-07-15 (×2): 5 mg via ORAL
  Filled 2022-07-14 (×2): qty 1

## 2022-07-14 MED ORDER — MENTHOL 3 MG MT LOZG: 1.0000 | LOZENGE | OROMUCOSAL | Status: DC | PRN

## 2022-07-14 MED ORDER — PROPOFOL 10 MG/ML IV BOLUS
INTRAVENOUS | Status: DC | PRN
  Administered 2022-07-14 (×4): 20 mg via INTRAVENOUS
  Administered 2022-07-14: 30 mg via INTRAVENOUS
  Administered 2022-07-14: 10 mg via INTRAVENOUS
  Administered 2022-07-14: 30 mg via INTRAVENOUS

## 2022-07-14 MED ORDER — SODIUM CHLORIDE 0.9% FLUSH
INTRAVENOUS | Status: DC | PRN
  Administered 2022-07-14: 30 mL

## 2022-07-14 MED ORDER — DOCUSATE SODIUM 100 MG PO CAPS
100.0000 mg | ORAL_CAPSULE | Freq: Two times a day (BID) | ORAL | Status: DC
  Administered 2022-07-14 – 2022-07-15 (×2): 100 mg via ORAL
  Filled 2022-07-14 (×2): qty 1

## 2022-07-14 SURGICAL SUPPLY — 75 items
AUG FEM DIST KNEE 9/9+ 10 (Joint) ×2 IMPLANT
AUGMENT FEM DIST KNEE 9/9+ 10 (Joint) IMPLANT
AUGMENT PSN FEM DIST SZ9 5 (Miscellaneous) IMPLANT
AUGMENT TIB TI 5 SZ EF INSIDE (Joint) IMPLANT
AUGMENT TIB TI 5 SZ EF OUTSIDE (Joint) IMPLANT
BAG COUNTER SPONGE SURGICOUNT (BAG) IMPLANT
BLADE HEX COATED 2.75 (ELECTRODE) ×2 IMPLANT
BLADE SAG 18X100X1.27 (BLADE) ×2 IMPLANT
BLADE SAW SAG 35X64 .89 (BLADE) ×2 IMPLANT
BNDG ELASTIC 6X10 VLCR STRL LF (GAUZE/BANDAGES/DRESSINGS) ×2 IMPLANT
BNDG ELASTIC 6X15 VLCR STRL LF (GAUZE/BANDAGES/DRESSINGS) IMPLANT
BOWL SMART MIX CTS (DISPOSABLE) ×2 IMPLANT
CANISTER WOUND CARE 500ML ATS (WOUND CARE) IMPLANT
CEMENT BONE REFOBACIN R1X40 US (Cement) ×4 IMPLANT
CHLORAPREP W/TINT 26 (MISCELLANEOUS) ×4 IMPLANT
COMP FEM TI MET KNEE 9 (Knees) ×2 IMPLANT
COMPONENT FEM TI MET KNEE 9 (Knees) IMPLANT
COMPONENT PSN TIB FIX SZ F LT (Miscellaneous) IMPLANT
CONE TIB CENTRAL TM MED (Joint) IMPLANT
COVER SURGICAL LIGHT HANDLE (MISCELLANEOUS) ×2 IMPLANT
CUFF TOURN SGL QUICK 34 (TOURNIQUET CUFF) ×2
CUFF TRNQT CYL 34X4.125X (TOURNIQUET CUFF) ×2 IMPLANT
DERMABOND ADVANCED .7 DNX12 (GAUZE/BANDAGES/DRESSINGS) ×2 IMPLANT
DRAPE INCISE IOBAN 66X45 STRL (DRAPES) IMPLANT
DRAPE INCISE IOBAN 85X60 (DRAPES) ×2 IMPLANT
DRAPE SHEET LG 3/4 BI-LAMINATE (DRAPES) ×2 IMPLANT
DRAPE U-SHAPE 47X51 STRL (DRAPES) ×2 IMPLANT
DRESSING AQUACEL AG SP 3.5X10 (GAUZE/BANDAGES/DRESSINGS) ×2 IMPLANT
DRESSING PREVENA PLUS CUSTOM (GAUZE/BANDAGES/DRESSINGS) IMPLANT
DRSG AQUACEL AG ADV 3.5X10 (GAUZE/BANDAGES/DRESSINGS) IMPLANT
DRSG AQUACEL AG SP 3.5X10 (GAUZE/BANDAGES/DRESSINGS)
DRSG PREVENA PLUS CUSTOM (GAUZE/BANDAGES/DRESSINGS) ×2
GLOVE BIO SURGEON STRL SZ7.5 (GLOVE) ×4 IMPLANT
GLOVE BIO SURGEON STRL SZ8 (GLOVE) ×4 IMPLANT
GLOVE BIOGEL PI IND STRL 7.5 (GLOVE) ×2 IMPLANT
GLOVE BIOGEL PI IND STRL 8 (GLOVE) ×2 IMPLANT
GOWN STRL REUS W/ TWL XL LVL3 (GOWN DISPOSABLE) ×2 IMPLANT
GOWN STRL REUS W/TWL XL LVL3 (GOWN DISPOSABLE) ×2
HANDPIECE INTERPULSE COAX TIP (DISPOSABLE) ×2
HDLS TROCR DRIL PIN KNEE 75 (PIN) ×2
HOLDER FOLEY CATH W/STRAP (MISCELLANEOUS) IMPLANT
HOOD PEEL AWAY T7 (MISCELLANEOUS) ×6 IMPLANT
INSERT ASF VE PS 14 6-9/EF LT (Insert) IMPLANT
INSTR SCRW HEX REV FIX 3.5X48 (ORTHOPEDIC DISPOSABLE SUPPLIES) ×6
INSTRUMENT SCRW HEX REV 3.5X48 (ORTHOPEDIC DISPOSABLE SUPPLIES) IMPLANT
KIT DRSG PREVENA PLUS 7DAY 125 (MISCELLANEOUS) IMPLANT
MANIFOLD NEPTUNE II (INSTRUMENTS) ×2 IMPLANT
MARKER SKIN DUAL TIP RULER LAB (MISCELLANEOUS) ×4 IMPLANT
NS IRRIG 1000ML POUR BTL (IV SOLUTION) ×2 IMPLANT
PACK TOTAL KNEE CUSTOM (KITS) ×2 IMPLANT
PIN DRILL HDLS TROCAR 75 4PK (PIN) IMPLANT
PROTECTOR NERVE ULNAR (MISCELLANEOUS) ×2 IMPLANT
SCREW HEX HEADED 3.5X27 DISP (ORTHOPEDIC DISPOSABLE SUPPLIES) IMPLANT
SET HNDPC FAN SPRY TIP SCT (DISPOSABLE) ×2 IMPLANT
SOLUTION IRRIG SURGIPHOR (IV SOLUTION) ×2 IMPLANT
SPIKE FLUID TRANSFER (MISCELLANEOUS) ×2 IMPLANT
SPONGE T-LAP 18X18 ~~LOC~~+RFID (SPONGE) IMPLANT
STEM FEM CMTLS EXT 18X135X6 (Stem) IMPLANT
STEM FEM PROS 13X135X3 (Stem) IMPLANT
STRIP CLOSURE SKIN 1/2X4 (GAUZE/BANDAGES/DRESSINGS) IMPLANT
SUT ETHILON 3 0 PS 1 (SUTURE) ×8 IMPLANT
SUT MNCRL AB 3-0 PS2 18 (SUTURE) ×2 IMPLANT
SUT STRATAFIX 0 PDS 27 VIOLET (SUTURE) ×2
SUT STRATAFIX 1PDS 45CM VIOLET (SUTURE) ×2 IMPLANT
SUT STRATAFIX PDO 1 14 VIOLET (SUTURE) ×2
SUT STRATFX PDO 1 14 VIOLET (SUTURE) ×2
SUT VIC AB 2-0 CT2 27 (SUTURE) ×4 IMPLANT
SUTURE STRATFX 0 PDS 27 VIOLET (SUTURE) ×2 IMPLANT
SUTURE STRATFX PDO 1 14 VIOLET (SUTURE) ×2 IMPLANT
SYR 50ML LL SCALE MARK (SYRINGE) ×2 IMPLANT
TRAY FOLEY MTR SLVR 14FR STAT (SET/KITS/TRAYS/PACK) IMPLANT
TRAY FOLEY MTR SLVR 16FR STAT (SET/KITS/TRAYS/PACK) IMPLANT
TUBE SUCTION HIGH CAP CLEAR NV (SUCTIONS) ×2 IMPLANT
UNDERPAD 30X36 HEAVY ABSORB (UNDERPADS AND DIAPERS) ×2 IMPLANT
WRAP KNEE MAXI GEL POST OP (GAUZE/BANDAGES/DRESSINGS) IMPLANT

## 2022-07-14 NOTE — Anesthesia Procedure Notes (Signed)
Anesthesia Regional Block: Adductor canal block   Pre-Anesthetic Checklist: , timeout performed,  Correct Patient, Correct Site, Correct Laterality,  Correct Procedure, Correct Position, site marked,  Risks and benefits discussed,  Surgical consent,  Pre-op evaluation,  At surgeon's request and post-op pain management  Laterality: Left  Prep: chloraprep       Needles:  Injection technique: Single-shot  Needle Type: Echogenic Needle     Needle Length: 9cm      Additional Needles:   Procedures:,,,, ultrasound used (permanent image in chart),,    Narrative:  Start time: 07/14/2022 11:30 AM End time: 07/14/2022 11:37 AM Injection made incrementally with aspirations every 5 mL.  Performed by: Personally  Anesthesiologist: Myrtie Soman, MD  Additional Notes: Patient tolerated the procedure well without complications

## 2022-07-14 NOTE — Plan of Care (Signed)
?  Problem: Activity: ?Goal: Risk for activity intolerance will decrease ?Outcome: Progressing ?  ?Problem: Safety: ?Goal: Ability to remain free from injury will improve ?Outcome: Progressing ?  ?Problem: Pain Managment: ?Goal: General experience of comfort will improve ?Outcome: Progressing ?  ?

## 2022-07-14 NOTE — Progress Notes (Signed)
Orthopedic Tech Progress Note Patient Details:  Cameron Holmes Apr 06, 1951 [id number]  Patient ID: Cameron Holmes, male   DOB: Jul 31, 1950, 72 y.o.   MRN: [id number]  Cameron Holmes 07/14/2022, 5:37 PM Left bone foam applied

## 2022-07-14 NOTE — Interval H&P Note (Signed)
The patient has been re-examined, and the chart reviewed, and there have been no interval changes to the documented history and physical.    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 Well healed old incision.   The risks, benefits, and alternatives have been discussed at length with patient, and the patient is willing to proceed.  Both knees were marked. Consent has been signed.

## 2022-07-14 NOTE — Anesthesia Procedure Notes (Signed)
Spinal  Patient location during procedure: OR Start time: 07/14/2022 12:01 PM End time: 07/14/2022 12:06 PM Reason for block: surgical anesthesia Staffing Performed: anesthesiologist  Anesthesiologist: Myrtie Soman, MD Performed by: Myrtie Soman, MD Authorized by: Myrtie Soman, MD   Preanesthetic Checklist Completed: patient identified, IV checked, site marked, risks and benefits discussed, surgical consent, monitors and equipment checked, pre-op evaluation and timeout performed Spinal Block Patient position: sitting Prep: Betadine Patient monitoring: heart rate, continuous pulse ox and blood pressure Approach: midline Location: L3-4 Injection technique: single-shot Needle Needle type: Sprotte  Needle gauge: 24 G Needle length: 9 cm Assessment Sensory level: T6 Events: CSF return Additional Notes

## 2022-07-14 NOTE — Discharge Instructions (Signed)

## 2022-07-14 NOTE — Anesthesia Preprocedure Evaluation (Signed)
Anesthesia Evaluation  Patient identified by MRN, date of birth, ID band Patient awake    Reviewed: Allergy & Precautions, H&P , NPO status , Patient's Chart, lab work & pertinent test results  Airway Mallampati: II  TM Distance: >3 FB Neck ROM: Full    Dental no notable dental hx.    Pulmonary neg pulmonary ROS, former smoker   Pulmonary exam normal breath sounds clear to auscultation       Cardiovascular hypertension, Normal cardiovascular exam Rhythm:Regular Rate:Normal     Neuro/Psych negative neurological ROS  negative psych ROS   GI/Hepatic negative GI ROS, Neg liver ROS,,,  Endo/Other  negative endocrine ROS    Renal/GU negative Renal ROS  negative genitourinary   Musculoskeletal  (+) Arthritis , Osteoarthritis,    Abdominal   Peds negative pediatric ROS (+)  Hematology negative hematology ROS (+)   Anesthesia Other Findings   Reproductive/Obstetrics negative OB ROS                             Anesthesia Physical Anesthesia Plan  ASA: 2  Anesthesia Plan: Spinal   Post-op Pain Management: Regional block*   Induction: Intravenous  PONV Risk Score and Plan: 1 and Propofol infusion, Treatment may vary due to age or medical condition and Ondansetron  Airway Management Planned: Simple Face Mask  Additional Equipment:   Intra-op Plan:   Post-operative Plan:   Informed Consent: I have reviewed the patients History and Physical, chart, labs and discussed the procedure including the risks, benefits and alternatives for the proposed anesthesia with the patient or authorized representative who has indicated his/her understanding and acceptance.     Dental advisory given  Plan Discussed with: CRNA and Surgeon  Anesthesia Plan Comments:        Anesthesia Quick Evaluation

## 2022-07-14 NOTE — Anesthesia Postprocedure Evaluation (Signed)
Anesthesia Post Note  Patient: Breyon Grieshaber  Procedure(s) Performed: TOTAL KNEE REVISION (Left: Knee) KNEE INJECTION (Right: Knee)     Patient location during evaluation: PACU Anesthesia Type: Spinal and Regional Level of consciousness: oriented and awake and alert Pain management: pain level controlled Vital Signs Assessment: post-procedure vital signs reviewed and stable Respiratory status: spontaneous breathing and respiratory function stable Cardiovascular status: blood pressure returned to baseline and stable Postop Assessment: no headache, no backache, no apparent nausea or vomiting, spinal receding and patient able to bend at knees Anesthetic complications: no  No notable events documented.  Last Vitals:  Vitals:   07/14/22 1630 07/14/22 1645  BP: 120/73 139/68  Pulse: (!) 58 66  Resp: 17 (!) 23  Temp:    SpO2: 95% 94%    Last Pain:  Vitals:   07/14/22 1622  TempSrc:   PainSc: 0-No pain                 Janalyn Higby,W. EDMOND

## 2022-07-14 NOTE — Transfer of Care (Signed)
Immediate Anesthesia Transfer of Care Note  Patient: Cameron Holmes  Procedure(s) Performed: TOTAL KNEE REVISION (Left: Knee) KNEE INJECTION (Right: Knee)  Patient Location: PACU  Anesthesia Type:Regional and MAC combined with regional for post-op pain  Level of Consciousness: awake, alert , oriented, and patient cooperative  Airway & Oxygen Therapy: Patient Spontanous Breathing  Post-op Assessment: Report given to RN and Post -op Vital signs reviewed and stable  Post vital signs: Reviewed and stable  Last Vitals:  Vitals Value Taken Time  BP    Temp    Pulse    Resp    SpO2      Last Pain:  Vitals:   07/14/22 1139  TempSrc:   PainSc: 0-No pain         Complications: No notable events documented.

## 2022-07-14 NOTE — Anesthesia Procedure Notes (Signed)
Anesthesia Procedure Image    

## 2022-07-14 NOTE — Op Note (Addendum)
DATE OF SURGERY:  07/14/2022 TIME: 3:37 PM  PATIENT NAME:  Cameron Holmes   AGE: 72 y.o.   PRE-OPERATIVE DIAGNOSIS: Aseptic loosening left total knee arthroplasty, right knee osteoarthritis  POST-OPERATIVE DIAGNOSIS:  Same  PROCEDURE: Left revision both components total Knee Arthroplasty, Right knee steroid injection  SURGEON:  Burnett Spray A Aryka Coonradt, MD   ASSISTANT: Izola Price, RNFA, present and scrubbed throughout the case, critical for assistance with exposure, retraction, instrumentation, and closure.   OPERATIVE IMPLANTS:  Biomet persona revision size medium trabecular metal tibial central cone, size F revision tibia cemented with 5 mm medial and lateral Hemi augments, 13 mm x 135 mm 3 mm offset spline press-fit stem, size 9+ cemented left revision femur, 10 mm lateral and 5 mm medial Hemi augments distally, 18 mm x 135 mm 6 mm offset splined press-fit stem, size 14 CPS fixed-bearing poly insert Implant Name Type Inv. Item Serial No. Manufacturer Lot No. LRB No. Used Action  CEMENT BONE REFOBACIN [id number] [place] - [id number] Cement CEMENT BONE REFOBACIN [id number] [place]  [employer]IMMER [employer]ON(ORTH,TRAU,BIO,SG) [id number] Left 1 Implanted  CEMENT BONE REFOBACIN [id number] [place] - [id number] Cement CEMENT BONE REFOBACIN R1X40 [place]  ZIMMER RECON(ORTH,TRAU,BIO,SG) [id number] Left 1 Implanted  COMP FEM TI MET KNEE 9 - [id number] Knees COMP FEM TI MET KNEE 9  ZIMMER RECON(ORTH,TRAU,BIO,SG) [id number] Left 1 Implanted  AUGMENT PSN FEM DIST SZ9 5 - [id number] Miscellaneous AUGMENT PSN FEM DIST SZ9 5  ZIMMER RECON(ORTH,TRAU,BIO,SG) [id number] Left 1 Implanted  AUG FEM DIST KNEE 9/9+ 10 - [id number] Joint AUG FEM DIST KNEE 9/9+ 10  ZIMMER RECON(ORTH,TRAU,BIO,SG) [id number] Left 1 Implanted  CONE TIB CENTRAL TM MED - [id number] Joint CONE TIB CENTRAL TM MED  ZIMMER RECON(ORTH,TRAU,BIO,SG) [id number] Left 1 Implanted  STEM FEM PROS [id number] - [id number] Stem STEM FEM PROS [id number]  ZIMMER RECON(ORTH,TRAU,BIO,SG) [id number] Left 1 Implanted   COMPONENT PSN TIB FIX SZ F LT - [id number] Miscellaneous COMPONENT PSN TIB FIX SZ F LT  ZIMMER RECON(ORTH,TRAU,BIO,SG) [id number] Left 1 Implanted  AUGMENT TIB TI 5 SZ EF INSIDE - [id number] Joint AUGMENT TIB TI 5 SZ EF INSIDE  ZIMMER RECON(ORTH,TRAU,BIO,SG) [id number] Left 1 Implanted  AUGMENT TIB TI 5 SZ EF OUTSIDE - [id number] Joint AUGMENT TIB TI 5 SZ EF OUTSIDE  ZIMMER RECON(ORTH,TRAU,BIO,SG) [id number] Left 1 Implanted  zimmer persona revision     [id number] Left 1 Implanted  INSERT ASF VE PS 14 6-9/EF LT - [id number] Insert INSERT ASF VE PS 14 6-9/EF LT  ZIMMER RECON(ORTH,TRAU,BIO,SG) [id number] Left 1 Implanted      PREOPERATIVE INDICATIONS:  Cameron Holmes is a 72 y.o. year old male with end stage bone on bone degenerative arthritis of the knee who failed conservative treatment, including injections, antiinflammatories, activity modification, and assistive devices, and had significant impairment of their activities of daily living, and elected for Total Knee Arthroplasty.   The risks, benefits, and alternatives were discussed at length including but not limited to the risks of infection, bleeding, nerve injury, stiffness, blood clots, the need for revision surgery, cardiopulmonary complications, among others, and they were willing to proceed.  OPERATIVE FINDINGS AND UNIQUE ASPECTS OF THE CASE: Osteolysis surrounding femoral component, subsidence of tibial component  ESTIMATED BLOOD LOSS: 150cc  OPERATIVE DESCRIPTION:  After the spinal was done and patient positioned supine the right knee was prepped with an alcohol swab and using an 18g needle 84m kenalog and 4cc 0.25% marcaine was injected into the anterolateral aspected of the knee. Gauze and tape was placed over the site as a dressing.  Once adequate anesthesia was induced, preoperative antibiotics, 2 gm of ancef,1 gm of Tranexamic Acid, and 8 mg of Decadron administered, the patient was positioned supine with a left thigh tourniquet  placed.  The left lower extremity was prepped and draped in sterile fashion.  A time-  out was performed identifying the patient, planned procedure, and the appropriate extremity.      The leg was  exsanguinated, tourniquet elevated to 250 mmHg.  A midline incision was made utilizing his old scar.  A medial parapatellar arthrotomy was performed.  Mild serous effusion was noted.  No evidence of purulence or infection.  A circumferential synovectomy was performed.  3 synovial tissue specimens were sent from the infrapatellar, lateral, medial gutters.  These were sent for aerobic anaerobic cultures.  We next performed a large medial release off the tibial plateau with Bovie cautery, to the posterior medial aspect of the tibia..   Suprapatellar fat pad was resected.  Soft tissue at the bone implant interface was resected.  Notably there was osteolysis appreciated between the cement bone interface on the medial lateral sides of the femur..  Using a small osteotome of the polyethylene liner was removed.  There was no significant wear or damage to the polyethylene insert. The patella had no damage and was otherwise well-fixed and was tracking appropriately.   We next turned our attention to explanting the femoral and tibial components.  The peripheral edge was exposed circumferentially around the femoral component using Bovie cautery and a rongeur.  Next using quarter inch straight osteotome and a small ACL blade saw.  We were able to break up the implant cement interface circumferentially.  Next using a bone tamp and mallet we were able to easily remove the femoral component.  There was minimal bone loss from the extraction.  Notably it appeared that the cement bone interface had debonded.   Next we turned our attention to explanting the tibial component.  The tibia was also exposed circumferentially and using a ACL saw blade and quarter inch osteotome we were able to break up the implant cement interface.  There  was subsidence of the tibial component anteriorly with a large anterior osteophyte.  Ultimately was able to be removed with only mild posterior lateral bone loss..  We then used the morelin instruments to remove the cement mantle including the cement from the keel.  Once this was removed we had access to the canal for reaming.  Morelin reverse curettes were then used to clear the femoral and tibial canals for reaming.     We next turned our attention to preparing the femoral and and tibial canal for a 135 mm press-fit stem.  We sequentially reamed up to 53m on the femur and 124mon the tibia which had good cortical fit..  We performed a freshen up cut of the distal femur and had good distal bone and due to some osteolysis of the distal femur cut for a 10 mm augment laterally and 5 mm augment medially.  Next we turned our attention to tibial preparation.  We then performed a freshen up cut of the proximal tibia at 0 degrees, cutting 75m475mff the posterior tibia with the stylus through the 0mm90mt.  Due to significant slope of the tibial component there is significant anterior bone removed requiring a 5 mm augment for the tibia.  Following the cut there was a flat flush surface without peripheral defect.  We then sized the tibial component to be a size  F which had good fit on the remaining tibial plateau.  And relative to the tibial canal the baseplate sat relatively anterior and medial.  We used a 3 mm offset and were able to bring the baseplate posterior and lateral.  There was moderate central bone loss from the old tibial keel.  We prepped the canal for a cone and had good fill and rotational stability with a size medium cone.  Given the amount of bone removed during the freshen up cut, elected for 5 mm offset on the tibia both medial and lateral.   Next we turned our attention to femoral sizing.  We felt based on a mild diameter and preop templating a size 9 standard femoral component was most appropriate  and match the anterior to posterior size of his prior implant.  The rotation of his prior implant does seem to match the transepicondylar access well, and the patient had good preoperative tracking.  There was mild anterior bone loss without significant posterior bone loss Used a 22m offset to bring the femoral component posterior and latearl to account for the anterior bone loss and tighten up the flexion space. Care was taken to appropriately externally rotate the femoral component just slightly more external copmared to the original implant to improve patellar tracking. There was mild posterior bone loss and had good posterior cortical contact through this there is 0 mm posterior recut both medially and laterally.  Next we carefully using Bovie cautery did a posterior capsule release to help improve extension.  Trial components were both inserted on the femur and tibia.  I felt that we had relatively symmetric flexion and extension gaps.  We sized up to the 14 mm insert which seem to be relatively stable in both flexion and extension.  To help tighten up the flexion gap some elected to use a 9+ femur this femur was exchanged with the current size 9 femur and felt to have better stability in flexion.    Knee felt stable to varus valgus stress although elected for CPS for final stability.  The patella was examined and found to be well-fixed.     During this time the tourniquet which was at 120 minutes was deflated, and left down for 20 minutes as we assembled the real components on the back table..   The tourniquet was reinflated for cementing.tibial cone was inserted which had good stability in the tibial bone.  The components were cemented using antibiotic cement.  Felt we had good press-fit with our stems. The knee was irrigated with surgery for irrigation and normal saline pulse lavage.  The synovial lining was  then injected a dilute Exparel.  We again checked stability and felt 14 mm insert gave  improved stability was appropriate with about 1 mm opening medially and 2 mm opening laterally.  And good stability in flexion as well with about 3 to 4 mm anterior to posterior translation. The final poly was inserted. patellar tracking was excellent.      The tourniquet was again let down.  No significant  hemostasis was required.  The medial parapatellar arthrotomy was then reapproximated using   #1 Stratafix sutures and #1 vicryl with the knee in 45 degrees of flexion.  The  remaining wound was closed with 0 stratafix, 2-0 Vicryl, and interrupted 3-0 Nylon  The knee was cleaned, dried, dressed with a Prevena incisional wound VAC.  The patient was then  brought to recovery room in stable condition, tolerating the procedure  well.  There were no complications.     Post op recs: WB: WBAT Abx: ancef  while in house, followed by 7 days of cefadroxil 500 twice daily, follow up intra-op tissue sent for culture. Imaging: PACU xrays DVT prophylaxis: Aspirin 62m BID x4 weeks Follow up: 2 weeks after surgery [facility]  [place]: [address] [address] Office Phone: [phone]

## 2022-07-14 NOTE — Anesthesia Procedure Notes (Signed)
Procedure Name: MAC Date/Time: 07/14/2022 12:03 PM  Performed by: Deliah Boston, CRNAPre-anesthesia Checklist: Patient identified, Emergency Drugs available, Suction available and Patient being monitored Patient Re-evaluated:Patient Re-evaluated prior to induction Oxygen Delivery Method: Simple face mask Preoxygenation: Pre-oxygenation with 100% oxygen Placement Confirmation: positive ETCO2 and breath sounds checked- equal and bilateral Dental Injury: Teeth and Oropharynx as per pre-operative assessment

## 2022-07-15 ENCOUNTER — Encounter (HOSPITAL_COMMUNITY): Payer: Self-pay | Admitting: Orthopedic Surgery

## 2022-07-15 LAB — CBC
HCT: 33.9 % — ABNORMAL LOW (ref 39.0–52.0)
Hemoglobin: 11.2 g/dL — ABNORMAL LOW (ref 13.0–17.0)
MCH: 29.5 pg (ref 26.0–34.0)
MCHC: 33 g/dL (ref 30.0–36.0)
MCV: 89.2 fL (ref 80.0–100.0)
Platelets: 190 10*3/uL (ref 150–400)
RBC: 3.8 MIL/uL — ABNORMAL LOW (ref 4.22–5.81)
RDW: 12.4 % (ref 11.5–15.5)
WBC: 12.9 10*3/uL — ABNORMAL HIGH (ref 4.0–10.5)
nRBC: 0 % (ref 0.0–0.2)

## 2022-07-15 LAB — BASIC METABOLIC PANEL
Anion gap: 9 (ref 5–15)
BUN: 17 mg/dL (ref 8–23)
CO2: 20 mmol/L — ABNORMAL LOW (ref 22–32)
Calcium: 8 mg/dL — ABNORMAL LOW (ref 8.9–10.3)
Chloride: 104 mmol/L (ref 98–111)
Creatinine, Ser: 0.97 mg/dL (ref 0.61–1.24)
GFR, Estimated: >60 mL/min (ref >60)
Glucose, Bld: 139 mg/dL — ABNORMAL HIGH (ref 70–99)
Potassium: 4.3 mmol/L (ref 3.5–5.1)
Sodium: 133 mmol/L — ABNORMAL LOW (ref 135–145)

## 2022-07-15 MED ORDER — ONDANSETRON HCL 4 MG PO TABS: 4.0000 mg | ORAL_TABLET | Freq: Three times a day (TID) | ORAL | 0 refills | Status: AC | PRN

## 2022-07-15 MED ORDER — METHOCARBAMOL 500 MG PO TABS: 500.0000 mg | ORAL_TABLET | Freq: Three times a day (TID) | ORAL | 0 refills | Status: AC | PRN

## 2022-07-15 MED ORDER — CELECOXIB 100 MG PO CAPS: 100.0000 mg | ORAL_CAPSULE | Freq: Two times a day (BID) | ORAL | 0 refills | Status: AC

## 2022-07-15 MED ORDER — CEFADROXIL 500 MG PO CAPS: 500.0000 mg | ORAL_CAPSULE | Freq: Two times a day (BID) | ORAL | 0 refills | Status: AC

## 2022-07-15 MED ORDER — OXYCODONE HCL 5 MG PO TABS: 5.0000 mg | ORAL_TABLET | ORAL | 0 refills | Status: AC | PRN

## 2022-07-15 MED ORDER — ACETAMINOPHEN 500 MG PO TABS: 1000.0000 mg | ORAL_TABLET | Freq: Three times a day (TID) | ORAL | 0 refills | Status: AC | PRN

## 2022-07-15 MED ORDER — ASPIRIN 81 MG PO TBEC: 81.0000 mg | DELAYED_RELEASE_TABLET | Freq: Two times a day (BID) | ORAL | 0 refills | Status: AC

## 2022-07-15 NOTE — Evaluation (Signed)
Physical Therapy One Time Evaluation Patient Details Name: Cameron Holmes MRN: [id number] DOB: [birth year] Today's Date: 07/15/2022  History of Present Illness  Pt is a 72 year old male s/p Left revision both components total Knee Arthroplasty, Right knee steroid injection on 07/25/22.  PMHx: L TKA, HLD, HTN  Clinical Impression  Patient evaluated by Physical Therapy with no further acute PT needs identified. All education has been completed and the patient has no further questions.  Pt ambulated in hallway, practiced steps, and performed exercises.  Pt provided with HEP handout.  See below for any follow-up Physical Therapy or equipment needs. PT is signing off. Thank you for this referral.        Recommendations for follow up therapy are one component of a multi-disciplinary discharge planning process, led by the attending physician.  Recommendations may be updated based on patient status, additional functional criteria and insurance authorization.  Follow Up Recommendations Follow physician's recommendations for discharge plan and follow up therapies      Assistance Recommended at Discharge Intermittent Supervision/Assistance  Patient can return home with the following       Equipment Recommendations None recommended by PT  Recommendations for Other Services       Functional Status Assessment Patient has had a recent decline in their functional status and demonstrates the ability to make significant improvements in function in a reasonable and predictable amount of time.     Precautions / Restrictions Precautions Precautions: Fall;Knee Precaution Comments: NPWT Restrictions Weight Bearing Restrictions: Yes LLE Weight Bearing: Weight bearing as tolerated      Mobility  Bed Mobility               General bed mobility comments: pt in recliner on arrival    Transfers Overall transfer level: Needs assistance Equipment used: Rolling walker (2 wheels) Transfers: Sit  to/from Stand Sit to Stand: Supervision           General transfer comment: cues for hand placcement    Ambulation/Gait Ambulation/Gait assistance: Min guard, Supervision Gait Distance (Feet): 200 Feet Assistive device: Rolling walker (2 wheels) Gait Pattern/deviations: Step-through pattern, Decreased stride length, Antalgic       General Gait Details: verbal cues for sequence, RW positioning, step length, posture  Stairs Stairs: Yes Stairs assistance: Min guard Stair Management: Step to pattern, Forwards, With walker Number of Stairs: 2 General stair comments: pt able to place RW at top and bottom and perform 2 steps once at a time, cues for sequence only; pt reports performing this way prior to surgery; no unsteadiness observed  Wheelchair Mobility    Modified Rankin (Stroke Patients Only)       Balance                                             Pertinent Vitals/Pain Pain Assessment Pain Assessment: 0-10 Pain Score: 3  Pain Location: left knee Pain Descriptors / Indicators: Spasm, Sore Pain Intervention(s): Repositioned, Monitored during session, Premedicated before session    Home Living Family/patient expects to be discharged to:: Private residence Living Arrangements: Spouse/significant other   Type of Home: House Home Access: Stairs to enter Entrance Stairs-Rails: None Entrance Stairs-Number of Steps: 2   Home Layout: Able to live on main level with bedroom/bathroom Home Equipment: Conservation officer, nature (2 wheels)      Prior Function Prior Level of Function : Independent/Modified  Independent                     Hand Dominance        Extremity/Trunk Assessment        Lower Extremity Assessment Lower Extremity Assessment: LLE deficits/detail LLE Deficits / Details: able to perform SLR, bil ankle pumps; AAROM left knee approx 8-60*       Communication   Communication: No difficulties  Cognition Arousal/Alertness:  Awake/alert Behavior During Therapy: WFL for tasks assessed/performed Overall Cognitive Status: Within Functional Limits for tasks assessed                                          General Comments      Exercises Total Joint Exercises Ankle Circles/Pumps: AROM, Both, 10 reps Quad Sets: AROM, Both, 10 reps Hip ABduction/ADduction: AROM, Left, 10 reps Straight Leg Raises: AROM, Left, 10 reps Knee Flexion: AROM, Seated, Left, 10 reps   Assessment/Plan    PT Assessment All further PT needs can be met in the next venue of care  PT Problem List Decreased mobility;Decreased activity tolerance;Decreased strength;Decreased range of motion;Pain       PT Treatment Interventions      PT Goals (Current goals can be found in the Care Plan section)  Acute Rehab PT Goals PT Goal Formulation: All assessment and education complete, DC therapy    Frequency       Co-evaluation               AM-PAC PT "6 Clicks" Mobility  Outcome Measure Help needed turning from your back to your side while in a flat bed without using bedrails?: A Little Help needed moving from lying on your back to sitting on the side of a flat bed without using bedrails?: A Little Help needed moving to and from a bed to a chair (including a wheelchair)?: A Little Help needed standing up from a chair using your arms (e.g., wheelchair or bedside chair)?: A Little Help needed to walk in hospital room?: A Little Help needed climbing 3-5 steps with a railing? : A Little 6 Click Score: 18    End of Session Equipment Utilized During Treatment: Gait belt Activity Tolerance: Patient tolerated treatment well Patient left: in chair;with call bell/phone within reach Nurse Communication: Mobility status PT Visit Diagnosis: Difficulty in walking, not elsewhere classified (R26.2)    Time: [id number] PT Time Calculation (min) (ACUTE ONLY): 22 min   Charges:   PT Evaluation $PT Eval Low Complexity: 1  Low        Kati PT, DPT Physical Therapist Acute Rehabilitation Services Preferred contact method: Secure Chat Weekend Pager Only: [phone] Office: [phone]   [name] 07/15/2022, 12:07 PM

## 2022-07-15 NOTE — Progress Notes (Signed)
     Subjective:  Patient reports pain as mild.  Has done well since surgery.  Has been out of bed with nursing.  Discussed intraoperative findings and postoperative plan.  Objective:   VITALS:   Vitals:   07/14/22 1739 07/14/22 2121 07/15/22 0142 07/15/22 0519  BP:  103/72 110/65 115/66  Pulse:  74 72 66  Resp:  17 17 17   Temp:  (!) 97.4 F (36.3 C) 98.6 F (37 C) 98.1 F (36.7 C)  TempSrc:      SpO2:  96% 94% 97%  Weight:      Height: 5\' 7"  (1.702 m)       Sensation intact distally Intact pulses distally Dorsiflexion/Plantar flexion intact Incision: dressing C/D/I Compartment soft Prevena wound VAC holding suction no output in the canister    Lab Results  Component Value Date   WBC 12.9 (H) 07/15/2022   HGB 11.2 (L) 07/15/2022   HCT 33.9 (L) 07/15/2022   MCV 89.2 07/15/2022   PLT 190 07/15/2022   BMET    Component Value Date/Time   NA 133 (L) 07/15/2022 0331   K 4.3 07/15/2022 0331   CL 104 07/15/2022 0331   CO2 20 (L) 07/15/2022 0331   GLUCOSE 139 (H) 07/15/2022 0331   BUN 17 07/15/2022 0331   CREATININE 0.97 07/15/2022 0331   CALCIUM 8.0 (L) 07/15/2022 0331   GFRNONAA >60 07/15/2022 0331      Xray: Revision knee arthroplasty components good position no adverse features  Assessment/Plan: 1 Day Post-Op   Principal Problem:   Prosthetic joint implant failure, initial encounter ([place])  Status post left revision arthroplasty for aseptic loosening 07/14/2022  Post op recs: WB: WBAT Abx: ancef  while in house, followed by 7 days of cefadroxil 500 twice daily Imaging: PACU xrays DVT prophylaxis: Aspirin 81mg  BID x4 weeks Follow up: 2 weeks after surgery for a wound check with Dr. Zachery Dakins at [facility].  Address: [address] [place], [place], [place] [address]  Office Phone: [phone]    Willaim Sheng 07/15/2022, 8:32 AM   Charlies Constable, MD  Contact information:   [phone] 7am-5pm epic message Dr.  Zachery Dakins, or call office for patient follow up: [phone] After hours and holidays please check [email or web address] for group call information for [facility]

## 2022-07-15 NOTE — Progress Notes (Signed)
Discharge instructions reviewed with patient including dressing, signs of infection, and new medications. Patient was also switched to portable wound vac and was taught how to charge and carry. Patient verbalized a full understanding to all instructions provided. Patients wife is his ride home.

## 2022-07-15 NOTE — TOC Transition Note (Signed)
Transition of Care [facility]) - CM/SW Discharge Note   Patient Details  Name: Cameron Holmes MRN: [id number] Date of Birth: 02-Jun-1950  [facility] CM/SW Contact:  Lennart Pall, LCSW Phone Number: 07/15/2022, 9:53 AM   Clinical Narrative:    Met with pt and confirming he has needed DME at home.  HHPT prearranged with [facility].  No further TOC needs.   Final next level of care: [place] Barriers to Discharge: No Barriers Identified   Patient Goals and CMS Choice      Discharge Placement                         Discharge Plan and Services Additional resources added to the After Visit Summary for                  DME Arranged: N/A DME Agency: NA                  Social Determinants of Health (SDOH) Interventions SDOH Screenings   Food Insecurity: No Food Insecurity (07/14/2022)  Housing: Low Risk  (07/14/2022)  Transportation Needs: No Transportation Needs (07/14/2022)  Utilities: Not At Risk (07/14/2022)  Tobacco Use: Medium Risk (07/14/2022)     Readmission Risk Interventions    07/15/2022    9:48 AM  Readmission Risk Prevention Plan  Post Dischage Appt Complete  Medication Screening Complete  Transportation Screening Complete

## 2022-07-16 LAB — SURGICAL PATHOLOGY

## 2022-07-16 NOTE — Discharge Summary (Addendum)
Physician Discharge Summary  Patient ID: Cameron Holmes MRN: [id number] DOB/AGE: 1951/04/29 72 y.o.  Admit date: 07/14/2022 Discharge date: 07/15/22  Admission Diagnoses:  Prosthetic joint implant failure, initial encounter [facility]) left Right knee osteoarthritis  Discharge Diagnoses:  Principal Problem:   Prosthetic joint implant failure, initial encounter ([place]) Left R knee osteoarthriotis   Past Medical History:  Diagnosis Date   Arthritis    Hypercholesteremia    Hypertension    Pneumonia     Surgeries: Procedure(s): TOTAL KNEE REVISION left KNEE INJECTION right on 07/14/2022   Consultants (if any):   Discharged Condition: Improved  Hospital Course: Cameron Holmes is an 73 y.o. male who was admitted 07/14/2022 with a diagnosis of Prosthetic joint implant failure, initial encounter ([place]) and went to the operating room on 07/14/2022 and underwent the above named procedures.    He was given perioperative antibiotics:  Anti-infectives (From admission, onward)    Start     Dose/Rate Route Frequency Ordered Stop   07/15/22 0000  cefadroxil (DURICEF) 500 MG capsule        500 mg Oral 2 times daily 07/15/22 0818 07/22/22 2359   07/14/22 2000  ceFAZolin (ANCEF) IVPB 2g/100 mL premix  Status:  Discontinued        2 g 200 mL/hr over 30 Minutes Intravenous Every 8 hours 07/14/22 1717 07/15/22 1748   07/14/22 1030  ceFAZolin (ANCEF) IVPB 2g/100 mL premix        2 g 200 mL/hr over 30 Minutes Intravenous On call to O.R. 07/14/22 1019 07/14/22 1207     .  He was given sequential compression devices, early ambulation, and aspirin for DVT prophylaxis.  He benefited maximally from the hospital stay and there were no complications.    Recent vital signs:  Vitals:   07/15/22 0519 07/15/22 0855  BP: 115/66 (!) 121/58  Pulse: 66 72  Resp: 17 18  Temp: 98.1 F (36.7 C) 97.9 F (36.6 C)  SpO2: 97% 97%    Recent laboratory studies:  Lab Results  Component Value Date   HGB 11.2  (L) 07/15/2022   HGB 14.3 07/05/2022   Lab Results  Component Value Date   WBC 12.9 (H) 07/15/2022   PLT 190 07/15/2022   No results found for: "INR" Lab Results  Component Value Date   NA 133 (L) 07/15/2022   K 4.3 07/15/2022   CL 104 07/15/2022   CO2 20 (L) 07/15/2022   BUN 17 07/15/2022   CREATININE 0.97 07/15/2022   GLUCOSE 139 (H) 07/15/2022    Discharge Medications:   Allergies as of 07/15/2022   No Known Allergies      Medication List     TAKE these medications    acetaminophen 500 MG tablet Commonly known as: TYLENOL Take 2 tablets (1,000 mg total) by mouth every 8 (eight) hours as needed.   amoxicillin 500 MG tablet Commonly known as: AMOXIL Take 2,000 mg by mouth daily as needed (prior to dental visits).   aspirin EC 81 MG tablet Take 1 tablet (81 mg total) by mouth 2 (two) times daily for 28 days. Swallow whole. What changed: when to take this   cefadroxil 500 MG capsule Commonly known as: DURICEF Take 1 capsule (500 mg total) by mouth 2 (two) times daily for 7 days.   celecoxib 100 MG capsule Commonly known as: CeleBREX Take 1 capsule (100 mg total) by mouth 2 (two) times daily for 14 days.   lisinopril 10 MG tablet Commonly known as:  ZESTRIL Take 10 mg by mouth daily.   methocarbamol 500 MG tablet Commonly known as: ROBAXIN Take 1 tablet (500 mg total) by mouth every 8 (eight) hours as needed for up to 10 days for muscle spasms.   ondansetron 4 MG tablet Commonly known as: Zofran Take 1 tablet (4 mg total) by mouth every 8 (eight) hours as needed for up to 14 days for nausea or vomiting.   oxyCODONE 5 MG immediate release tablet Commonly known as: Roxicodone Take 1 tablet (5 mg total) by mouth every 4 (four) hours as needed for up to 7 days for severe pain or moderate pain.   simvastatin 20 MG tablet Commonly known as: ZOCOR Take by mouth every evening.        Diagnostic Studies: DG Knee Left Port  Result Date:  07/14/2022 CLINICAL DATA:  Status post left knee revision EXAM: PORTABLE LEFT KNEE - 1-2 VIEW COMPARISON:  None Available. FINDINGS: Status post left knee total arthroplasty with expected overlying postoperative change. No evidence of perihardware fracture or component malpositioning. IMPRESSION: Status post left knee total arthroplasty with expected overlying postoperative change. No evidence of perihardware fracture or component malpositioning. Electronically Signed   By: Delanna Ahmadi M.D.   On: 07/14/2022 17:45    Disposition: Discharge disposition: 01-Home or Self Care       Discharge Instructions     Call MD / Call 911   Complete by: As directed    If you experience chest pain or shortness of breath, CALL 911 and be transported to the hospital emergency room.  If you develope a fever above 101 F, pus (white drainage) or increased drainage or redness at the wound, or calf pain, call your surgeon's office.   Constipation Prevention   Complete by: As directed    Drink plenty of fluids.  Prune juice may be helpful.  You may use a stool softener, such as Colace (over the counter) 100 mg twice a day.  Use MiraLax (over the counter) for constipation as needed.   Diet - low sodium heart healthy   Complete by: As directed    Increase activity slowly as tolerated   Complete by: As directed    Post-operative opioid taper instructions:   Complete by: As directed    POST-OPERATIVE OPIOID TAPER INSTRUCTIONS: It is important to wean off of your opioid medication as soon as possible. If you do not need pain medication after your surgery it is ok to stop day one. Opioids include: Codeine, Hydrocodone(Norco, Vicodin), Oxycodone(Percocet, oxycontin) and hydromorphone amongst others.  Long term and even short term use of opiods can cause: Increased pain response Dependence Constipation Depression Respiratory depression And more.  Withdrawal symptoms can include Flu like symptoms Nausea,  vomiting And more Techniques to manage these symptoms Hydrate well Eat regular healthy meals Stay active Use relaxation techniques(deep breathing, meditating, yoga) Do Not substitute Alcohol to help with tapering If you have been on opioids for less than two weeks and do not have pain than it is ok to stop all together.  Plan to wean off of opioids This plan should start within one week post op of your joint replacement. Maintain the same interval or time between taking each dose and first decrease the dose.  Cut the total daily intake of opioids by one tablet each day Next start to increase the time between doses. The last dose that should be eliminated is the evening dose.  Follow-up Information     [facility].   Specialty: [place] Why: to provide home physical therapy visits Contact information: [place] [address] [phone]                    Discharge Instructions      INSTRUCTIONS AFTER JOINT REPLACEMENT   Remove items at home which could result in a fall. This includes throw rugs or furniture in walking pathways ICE to the affected joint every three hours while awake for 30 minutes at a time, for at least the first 3-5 days, and then as needed for pain and swelling.  Continue to use ice for pain and swelling. You may notice swelling that will progress down to the foot and ankle.  This is normal after surgery.  Elevate your leg when you are not up walking on it.   Continue to use the breathing machine you got in the hospital (incentive spirometer) which will help keep your temperature down.  It is common for your temperature to cycle up and down following surgery, especially at night when you are not up moving around and exerting yourself.  The breathing machine keeps your lungs expanded and your temperature down.   DIET:  As you were doing prior to hospitalization, we recommend a  well-balanced diet.  DRESSING / WOUND CARE / SHOWERING  Keep the surgical dressing until follow up.  The dressing is water proof, so you can shower without any extra covering.  IF THE DRESSING FALLS OFF or the wound gets wet inside, change the dressing with sterile gauze.  Please use good hand washing techniques before changing the dressing.  Do not use any lotions or creams on the incision until instructed by your surgeon.    ACTIVITY  Increase activity slowly as tolerated, but follow the weight bearing instructions below.   No driving for 6 weeks or until further direction given by your physician.  You cannot drive while taking narcotics.  No lifting or carrying greater than 10 lbs. until further directed by your surgeon. Avoid periods of inactivity such as sitting longer than an hour when not asleep. This helps prevent blood clots.  You may return to work once you are authorized by your doctor.     WEIGHT BEARING   Weight bearing as tolerated with assist device (walker, cane, etc) as directed, use it as long as suggested by your surgeon or therapist, typically at least 4-6 weeks.   EXERCISES  Results after joint replacement surgery are often greatly improved when you follow the exercise, range of motion and muscle strengthening exercises prescribed by your doctor. Safety measures are also important to protect the joint from further injury. Any time any of these exercises cause you to have increased pain or swelling, decrease what you are doing until you are comfortable again and then slowly increase them. If you have problems or questions, call your caregiver or physical therapist for advice.   Rehabilitation is important following a joint replacement. After just a few days of immobilization, the muscles of the leg can become weakened and shrink (atrophy).  These exercises are designed to build up the tone and strength of the thigh and leg muscles and to improve motion. Often times heat  used for twenty to thirty minutes before working out will loosen up your tissues and help with improving the range of motion but do not use heat for the first two  weeks following surgery (sometimes heat can increase post-operative swelling).   These exercises can be done on a training (exercise) mat, on the floor, on a table or on a bed. Use whatever works the best and is most comfortable for you.    Use music or television while you are exercising so that the exercises are a pleasant break in your day. This will make your life better with the exercises acting as a break in your routine that you can look forward to.   Perform all exercises about fifteen times, three times per day or as directed.  You should exercise both the operative leg and the other leg as well.  Exercises include:   Quad Sets - Tighten up the muscle on the front of the thigh (Quad) and hold for 5-10 seconds.   Straight Leg Raises - With your knee straight (if you were given a brace, keep it on), lift the leg to 60 degrees, hold for 3 seconds, and slowly lower the leg.  Perform this exercise against resistance later as your leg gets stronger.  Leg Slides: Lying on your back, slowly slide your foot toward your buttocks, bending your knee up off the floor (only go as far as is comfortable). Then slowly slide your foot back down until your leg is flat on the floor again.  Angel Wings: Lying on your back spread your legs to the side as far apart as you can without causing discomfort.  Hamstring Strength:  Lying on your back, push your heel against the floor with your leg straight by tightening up the muscles of your buttocks.  Repeat, but this time bend your knee to a comfortable angle, and push your heel against the floor.  You may put a pillow under the heel to make it more comfortable if necessary.   A rehabilitation program following joint replacement surgery can speed recovery and prevent re-injury in the future due to weakened  muscles. Contact your doctor or a physical therapist for more information on knee rehabilitation.    CONSTIPATION  Constipation is defined medically as fewer than three stools per week and severe constipation as less than one stool per week.  Even if you have a regular bowel pattern at home, your normal regimen is likely to be disrupted due to multiple reasons following surgery.  Combination of anesthesia, postoperative narcotics, change in appetite and fluid intake all can affect your bowels.   YOU MUST use at least one of the following options; they are listed in order of increasing strength to get the job done.  They are all available over the counter, and you may need to use some, POSSIBLY even all of these options:    Drink plenty of fluids (prune juice may be helpful) and high fiber foods Colace 100 mg by mouth twice a day  Senokot for constipation as directed and as needed Dulcolax (bisacodyl), take with full glass of water  Miralax (polyethylene glycol) once or twice a day as needed.  If you have tried all these things and are unable to have a bowel movement in the first 3-4 days after surgery call either your surgeon or your primary doctor.    If you experience loose stools or diarrhea, hold the medications until you stool forms back up.  If your symptoms do not get better within 1 week or if they get worse, check with your doctor.  If you experience "the worst abdominal pain ever" or develop nausea or vomiting, please contact  the office immediately for further recommendations for treatment.   ITCHING:  If you experience itching with your medications, try taking only a single pain pill, or even half a pain pill at a time.  You can also use Benadryl over the counter for itching or also to help with sleep.   TED HOSE STOCKINGS:  Use stockings on both legs until for at least 2 weeks or as directed by physician office. They may be removed at night for sleeping.  MEDICATIONS:  See your  medication summary on the "After Visit Summary" that nursing will review with you.  You may have some home medications which will be placed on hold until you complete the course of blood thinner medication.  It is important for you to complete the blood thinner medication as prescribed.   Blood clot prevention (DVT Prophylaxis): After surgery you are at an increased risk for a blood clot. you were prescribed a blood thinner, Aspirin 48m, to be taken twice daily for a total of 4 weeks from surgery to help reduce your risk of getting a blood clot. This will help prevent a blood clot. Signs of a pulmonary embolus (blood clot in the lungs) include sudden short of breath, feeling lightheaded or dizzy, chest pain with a deep breath, rapid pulse rapid breathing. Signs of a blood clot in your arms or legs include new unexplained swelling and cramping, warm, red or darkened skin around the painful area. Please call the office or 911 right away if these signs or symptoms develop.  PRECAUTIONS:  If you experience chest pain or shortness of breath - call 911 immediately for transfer to the hospital emergency department.   If you develop a fever greater that 101 F, purulent drainage from wound, increased redness or drainage from wound, foul odor from the wound/dressing, or calf pain - CONTACT YOUR SURGEON.                                                   FOLLOW-UP APPOINTMENTS:  If you do not already have a post-op appointment, please call the office for an appointment to be seen by your surgeon.  Guidelines for how soon to be seen are listed in your "After Visit Summary", but are typically between 2-3 weeks after surgery.  OTHER INSTRUCTIONS:   Knee Replacement:  Do not place pillow under knee, focus on keeping the knee straight while resting.   DO NOT modify, tear, cut, or change the foam block in any way.  POST-OPERATIVE OPIOID TAPER INSTRUCTIONS: It is important to wean off of your opioid medication as soon  as possible. If you do not need pain medication after your surgery it is ok to stop day one. Opioids include: Codeine, Hydrocodone(Norco, Vicodin), Oxycodone(Percocet, oxycontin) and hydromorphone amongst others.  Long term and even short term use of opiods can cause: Increased pain response Dependence Constipation Depression Respiratory depression And more.  Withdrawal symptoms can include Flu like symptoms Nausea, vomiting And more Techniques to manage these symptoms Hydrate well Eat regular healthy meals Stay active Use relaxation techniques(deep breathing, meditating, yoga) Do Not substitute Alcohol to help with tapering If you have been on opioids for less than two weeks and do not have pain than it is ok to stop all together.  Plan to wean off of opioids This plan should start within one  week post op of your joint replacement. Maintain the same interval or time between taking each dose and first decrease the dose.  Cut the total daily intake of opioids by one tablet each day Next start to increase the time between doses. The last dose that should be eliminated is the evening dose.   MAKE SURE YOU:  Understand these instructions.  Get help right away if you are not doing well or get worse.    Thank you for letting us be a part of your medical care team.  It is a privilege we respect greatly.  We hope these instructions will help you stay on track for a fast and full recovery!           Signed: Musa Rewerts A Toluwani Yadav 07/16/2022, 6:58 AM
